# Patient Record
Sex: Male | Born: 1970 | Race: White | Hispanic: No | Marital: Single | Smoking: Former smoker
Health system: Southern US, Community
[De-identification: ages and names within clinical notes are randomized; demographics above are authoritative.]

## PROBLEM LIST (undated history)

## (undated) DIAGNOSIS — N200 Calculus of kidney: Secondary | ICD-10-CM

## (undated) HISTORY — PX: APPENDECTOMY: SHX54

## (undated) HISTORY — PX: BACK SURGERY: SHX140

---

## 2017-01-08 DIAGNOSIS — G8929 Other chronic pain: Secondary | ICD-10-CM | POA: Insufficient documentation

## 2017-01-08 DIAGNOSIS — M549 Dorsalgia, unspecified: Secondary | ICD-10-CM

## 2017-01-08 DIAGNOSIS — I10 Essential (primary) hypertension: Secondary | ICD-10-CM

## 2017-01-08 DIAGNOSIS — F17200 Nicotine dependence, unspecified, uncomplicated: Secondary | ICD-10-CM

## 2017-01-08 HISTORY — DX: Other chronic pain: G89.29

## 2017-01-08 HISTORY — DX: Dorsalgia, unspecified: M54.9

## 2017-01-08 HISTORY — DX: Nicotine dependence, unspecified, uncomplicated: F17.200

## 2017-01-08 HISTORY — DX: Essential (primary) hypertension: I10

## 2018-06-22 ENCOUNTER — Other Ambulatory Visit: Payer: Self-pay

## 2018-06-22 ENCOUNTER — Emergency Department
Admission: EM | Admit: 2018-06-22 | Discharge: 2018-06-22 | Disposition: A | Discharge status: Home / Self Care | Payer: Self-pay | Attending: Emergency Medicine | Admitting: Emergency Medicine

## 2018-06-22 ENCOUNTER — Emergency Department: Payer: Self-pay

## 2018-06-22 ENCOUNTER — Encounter: Payer: Self-pay | Admitting: Emergency Medicine

## 2018-06-22 DIAGNOSIS — N23 Unspecified renal colic: Secondary | ICD-10-CM | POA: Insufficient documentation

## 2018-06-22 HISTORY — DX: Calculus of kidney: N20.0

## 2018-06-22 LAB — CBC
HCT: 50.7 % (ref 39.0–52.0)
Hemoglobin: 17.1 g/dL — ABNORMAL HIGH (ref 13.0–17.0)
MCH: 29.7 pg (ref 26.0–34.0)
MCHC: 33.7 g/dL (ref 30.0–36.0)
MCV: 88.2 fL (ref 80.0–100.0)
Platelets: 245 10*3/uL (ref 150–400)
RBC: 5.75 MIL/uL (ref 4.22–5.81)
RDW: 13.3 % (ref 11.5–15.5)
WBC: 11.2 10*3/uL — ABNORMAL HIGH (ref 4.0–10.5)
nRBC: 0 % (ref 0.0–0.2)

## 2018-06-22 LAB — URINALYSIS, COMPLETE (UACMP) WITH MICROSCOPIC
Bacteria, UA: NONE SEEN
Bilirubin Urine: NEGATIVE
Glucose, UA: NEGATIVE mg/dL
Ketones, ur: NEGATIVE mg/dL
LEUKOCYTE UA: NEGATIVE
NITRITE: NEGATIVE
Protein, ur: NEGATIVE mg/dL
Specific Gravity, Urine: 1.021 (ref 1.005–1.030)
pH: 5 (ref 5.0–8.0)

## 2018-06-22 LAB — BASIC METABOLIC PANEL
Anion gap: 8 (ref 5–15)
BUN: 20 mg/dL (ref 6–20)
CO2: 26 mmol/L (ref 22–32)
Calcium: 9.2 mg/dL (ref 8.9–10.3)
Chloride: 104 mmol/L (ref 98–111)
Creatinine, Ser: 0.94 mg/dL (ref 0.61–1.24)
GFR calc Af Amer: >60 mL/min (ref >60)
GFR calc non Af Amer: >60 mL/min (ref >60)
Glucose, Bld: 117 mg/dL — ABNORMAL HIGH (ref 70–99)
POTASSIUM: 3.9 mmol/L (ref 3.5–5.1)
Sodium: 138 mmol/L (ref 135–145)

## 2018-06-22 MED ORDER — ONDANSETRON 4 MG PO TBDP
4.0000 mg | ORAL_TABLET | Freq: Once | ORAL | Status: AC
  Administered 2018-06-22: 4 mg via ORAL
  Filled 2018-06-22: qty 1

## 2018-06-22 MED ORDER — OXYCODONE HCL 5 MG PO TABS
5.0000 mg | ORAL_TABLET | Freq: Once | ORAL | Status: AC
  Administered 2018-06-22: 5 mg via ORAL
  Filled 2018-06-22: qty 1

## 2018-06-22 MED ORDER — TAMSULOSIN HCL 0.4 MG PO CAPS: 0.4000 mg | ORAL_CAPSULE | Freq: Every day | ORAL | 0 refills | Status: DC

## 2018-06-22 NOTE — ED Triage Notes (Signed)
Pt c/o R flank pain x 3 hrs with sudden onset. Pt states hx of kidney stones, states this feels similar. Pt also c/o some dysuria at this time.

## 2018-06-22 NOTE — ED Provider Notes (Signed)
[facility] Emergency Department Provider Note  ____________________________________________   First MD Initiated Contact with Patient 06/22/18 1926     (approximate)  I have reviewed the triage vital signs and the nursing notes.   HISTORY  Chief Complaint Flank Pain   HPI Darryl Harris is a 48 y.o. male who presents to the emergency department for treatment and evaluation of sudden onset right flank pain that started approximately 5 hours ago.  Patient has a history of kidney stones and this feels the same.  Last June was about 6 years ago.  It was 7 mm and he had to have a renal stent that time.  Since onset of pain today, he has had nausea without vomiting and has been "doubled over in pain."  No alleviating measures prior to arrival.    Past Medical History:  Diagnosis Date  . Kidney stones     There are no active problems to display for this patient.   Past Surgical History:  Procedure Laterality Date  . APPENDECTOMY    . BACK SURGERY      Prior to Admission medications   Medication Sig Start Date End Date Taking? Authorizing Provider  tamsulosin (FLOMAX) 0.4 MG CAPS capsule Take 1 capsule (0.4 mg total) by mouth daily. 06/22/18   Chinita Pester, FNP    Allergies Nsaids and Tylenol [acetaminophen]  History reviewed. No pertinent family history.  Social History Social History   Tobacco Use  . Smoking status: Former Games developer  . Smokeless tobacco: Former Engineer, water Use Topics  . Alcohol use: Not Currently  . Drug use: Never    Review of Systems  Constitutional: No fever/chills Eyes: No visual changes. ENT: No sore throat. Cardiovascular: Denies chest pain. Respiratory: Denies shortness of breath. Gastrointestinal: No abdominal pain.  Positive for nausea, no vomiting.  No diarrhea.  No constipation. Genitourinary: Positive for dysuria.   Musculoskeletal: Negative for back pain.  Positive for right flank pain Skin:  Negative for rash. Neurological: Negative for headaches, focal weakness or numbness.  ____________________________________________   PHYSICAL EXAM:  VITAL SIGNS: ED Triage Vitals  Enc Vitals Group     BP 06/22/18 1635 (!) 185/103     Pulse Rate 06/22/18 1635 98     Resp 06/22/18 1635 18     Temp 06/22/18 1635 98.4 F (36.9 C)     Temp Source 06/22/18 1635 Oral     SpO2 06/22/18 1635 99 %     Weight 06/22/18 1636 175 lb (79.4 kg)     Height 06/22/18 1636 5\' 7"  (1.702 m)     Head Circumference --      Peak Flow --      Pain Score 06/22/18 1636 8     Pain Loc --      Pain Edu? --      Excl. in [place]? --     Constitutional: Alert and oriented.  Uncomfortable appearing  Eyes: Conjunctivae are normal. Head: Atraumatic. Nose: No congestion/rhinnorhea. Mouth/Throat: Mucous membranes are moist.  Neck: No stridor.   Cardiovascular:  Good peripheral circulation. Respiratory: Normal respiratory effort.  No retractions. Lungs CTAB. Gastrointestinal: Soft and nontender. No distention. No abdominal bruits.  Right side CVA tenderness. Musculoskeletal: No lower extremity tenderness nor edema.   Neurologic:  Normal speech and language. No gross focal neurologic deficits are appreciated. No gait instability. Skin:  Skin is warm, dry and intact. Psychiatric: Mood and affect are normal. Speech and behavior are normal.  ____________________________________________  LABS (all labs ordered are listed, but only abnormal results are displayed)  Labs Reviewed  URINALYSIS, COMPLETE (UACMP) WITH MICROSCOPIC - Abnormal; Notable for the following components:      Result Value   Color, Urine YELLOW (*)    APPearance CLEAR (*)    Hgb urine dipstick MODERATE (*)    All other components within normal limits  BASIC METABOLIC PANEL - Abnormal; Notable for the following components:   Glucose, Bld 117 (*)    All other components within normal limits  CBC - Abnormal; Notable for the following  components:   WBC 11.2 (*)    Hemoglobin 17.1 (*)    All other components within normal limits   ____________________________________________  EKG  Not indicated ____________________________________________  RADIOLOGY  ED MD interpretation: 1 mm right lower pole renal stone without ureteral stone or hydronephrosis  Official radiology report(s): Ct Renal Stone Study  Result Date: 06/22/2018 CLINICAL DATA:  Right flank pain EXAM: CT ABDOMEN AND PELVIS WITHOUT CONTRAST TECHNIQUE: Multidetector CT imaging of the abdomen and pelvis was performed following the standard protocol without IV contrast. COMPARISON:  None. FINDINGS: Lower chest: Lung bases are clear. No effusions. Heart is normal size. Hepatobiliary: Diffuse low-density throughout the liver compatible with fatty infiltration. No focal abnormality. Gallbladder unremarkable. Pancreas: No focal abnormality or ductal dilatation. Spleen: No focal abnormality.  Normal size. Adrenals/Urinary Tract: Punctate nonobstructing 1 mm stone in the lower pole of the right kidney. No hydronephrosis or ureteral stones. No suspicious renal or adrenal mass. Urinary bladder unremarkable. Stomach/Bowel: Stomach, large and small bowel grossly unremarkable. Vascular/Lymphatic: No evidence of aneurysm or adenopathy. Reproductive: No visible focal abnormality. Other: No free fluid or free air. Musculoskeletal: Postoperative changes in the lower lumbar spine. No acute bony abnormality. IMPRESSION: Punctate 1 mm right lower pole renal stone. No ureteral stones or hydronephrosis. Fatty infiltration of the liver. No acute findings. Electronically Signed   By: Charlett Nose M.D.   On: 06/22/2018 20:24    ____________________________________________   PROCEDURES  Procedure(s) performed (including Critical Care):  Procedures   ____________________________________________   INITIAL IMPRESSION / ASSESSMENT AND PLAN / ED COURSE  As part of my medical decision  making, I reviewed the following data within the electronic MEDICAL RECORD NUMBER [place] Controlled Substance Database    48 year old male presents to the emergency department for treatment and evaluation of sudden onset right flank pain.  1 mm renal stone noted in the lower pole the right kidney without obstruction or hydronephrosis.  He was given Zofran and OxyIR.  Pain is somewhat better.  Awaiting urinalysis results.  ----------------------------------------- 9:41 PM on 06/22/2018 -----------------------------------------  Upon review of care everywhere, it appears that the patient has checked in to be evaluated at several hospitals today as well as visits on February 19 as well as a few visits in January.  Upon review of his controlled substance registry, he has multiple prescribers and multiple pharmacies.  Therefore, tonight he will not be given a prescription for any type of controlled substance.  He will be given Flomax.  He states that he is allergic to Tylenol and ibuprofen so there is not many options left for pain control.  He was advised that he should follow-up with urology. __________________________________________   FINAL CLINICAL IMPRESSION(S) / ED DIAGNOSES  Final diagnoses:  Renal colic on right side     ED Discharge Orders         Ordered    tamsulosin (FLOMAX) 0.4 MG CAPS capsule  Daily     06/22/18 2136           Note:  This document was prepared using Dragon voice recognition software and may include unintentional dictation errors.    Chinita Pester, FNP 06/22/18 2143    Phineas Semen, MD 06/22/18 [phone]

## 2019-05-10 ENCOUNTER — Other Ambulatory Visit: Payer: Self-pay

## 2019-05-10 ENCOUNTER — Emergency Department
Admission: EM | Admit: 2019-05-10 | Discharge: 2019-05-10 | Disposition: A | Discharge status: Home / Self Care | Payer: Self-pay | Attending: Emergency Medicine | Admitting: Emergency Medicine

## 2019-05-10 ENCOUNTER — Encounter: Payer: Self-pay | Admitting: Emergency Medicine

## 2019-05-10 ENCOUNTER — Emergency Department: Payer: Self-pay

## 2019-05-10 DIAGNOSIS — Z87891 Personal history of nicotine dependence: Secondary | ICD-10-CM | POA: Insufficient documentation

## 2019-05-10 DIAGNOSIS — M545 Low back pain, unspecified: Secondary | ICD-10-CM

## 2019-05-10 MED ORDER — CYCLOBENZAPRINE HCL 10 MG PO TABS: 10.0000 mg | ORAL_TABLET | Freq: Three times a day (TID) | ORAL | 0 refills | Status: AC | PRN

## 2019-05-10 MED ORDER — ORPHENADRINE CITRATE 30 MG/ML IJ SOLN
60.0000 mg | Freq: Two times a day (BID) | INTRAMUSCULAR | Status: DC
  Administered 2019-05-10: 13:00:00 60 mg via INTRAMUSCULAR
  Filled 2019-05-10: qty 2

## 2019-05-10 MED ORDER — LIDOCAINE 5 % EX PTCH
1.0000 | MEDICATED_PATCH | CUTANEOUS | Status: DC
  Filled 2019-05-10: qty 1

## 2019-05-10 MED ORDER — LIDOCAINE 4 % EX PTCH: 1.0000 | MEDICATED_PATCH | Freq: Every day | CUTANEOUS | 0 refills | Status: AC

## 2019-05-10 NOTE — ED Provider Notes (Signed)
[facility] Emergency Department Provider Note ____________________________________________  Time seen: Approximately 2:55 PM  I have reviewed the triage vital signs and the nursing notes.  HISTORY  Chief Complaint Back Pain   HPI Darryl Harris is a 49 y.o. male presents to the emergency department after falling about 4 feet off a step ladder. He states he was hanging a light for his mother and lost his balance. He denies striking his head or loss of consciousness. Back surgery several years ago. No alleviating measures attempted prior to arrival.  Past Medical History:  Diagnosis Date  . Kidney stones     There are no problems to display for this patient.   Past Surgical History:  Procedure Laterality Date  . APPENDECTOMY    . BACK SURGERY      Prior to Admission medications   Medication Sig Start Date End Date Taking? Authorizing Provider  cyclobenzaprine (FLEXERIL) 10 MG tablet Take 1 tablet (10 mg total) by mouth 3 (three) times daily as needed. 05/10/19   Tylon Kemmerling B, FNP  Lidocaine 4 % PTCH Apply 1 patch topically daily. 05/10/19   Victorino Dike, FNP    Allergies Nsaids and Tylenol [acetaminophen]  No family history on file.  Social History Social History   Tobacco Use  . Smoking status: Former Research scientist (life sciences)  . Smokeless tobacco: Former Network engineer Use Topics  . Alcohol use: Not Currently  . Drug use: Never    Review of Systems Constitutional: Well appearing. Respiratory: Negative for dyspnea. Cardiovascular: Negative for change in skin temperature or color. Musculoskeletal:   Negative for chronic steroid use   Negative for trauma in the presence of osteoporosis  Negative for age over 39 and trauma.  Negative for constitutional symptoms, or history of cancer  Negative for pain worse at night. Skin: Negative for rash, lesion, or wound.  Genitourinary: Negative for urinary retention. Rectal: Negative for fecal  incontinence or new onset constipation/bowel habit changes. Hematological/Immunilogical: Negative for immunosuppression, IV drug use, or fever Neurological: Negative for burning, tingling, numb, electric, radiating pain in the extremities.                        Negative for saddle anesthesia.                        Negative for focal neurologic deficit, progressive or disabling symptoms             Negative for saddle anesthesia. ____________________________________________   PHYSICAL EXAM:  VITAL SIGNS: ED Triage Vitals  Enc Vitals Group     BP 05/10/19 1203 (!) 154/92     Pulse Rate 05/10/19 1203 94     Resp 05/10/19 1203 20     Temp 05/10/19 1203 98 F (36.7 C)     Temp Source 05/10/19 1203 Oral     SpO2 05/10/19 1203 98 %     Weight 05/10/19 1205 170 lb (77.1 kg)     Height 05/10/19 1205 5\' 7"  (1.702 m)     Head Circumference --      Peak Flow --      Pain Score 05/10/19 1205 8     Pain Loc --      Pain Edu? --      Excl. in [place]? --     Constitutional: Alert and oriented. Well appearing and in no acute distress. Eyes: Conjunctivae are clear without discharge or drainage.  Head:  Atraumatic. Neck: Full, active range of motion. Respiratory: Respirations even and unlabored. Musculoskeletal: Full ROM of the back and extremities observed, Strength 5/5 of the lower extremities as tested. Neurologic: Reflexes of the lower extremities are 2+. Negative straight leg raise on the right and left side. Skin: Atraumatic.  Psychiatric: Behavior and affect are normal.  ____________________________________________   LABS (all labs ordered are listed, but only abnormal results are displayed)  Labs Reviewed - No data to display ____________________________________________  RADIOLOGY  No traumatic finding of lumbar spine. ____________________________________________   PROCEDURES  Procedure(s) performed:  Procedures ____________________________________________   INITIAL  IMPRESSION / ASSESSMENT AND PLAN / ED COURSE  Darryl Harris is a 49 y.o. male presents to the emergency department after reported fall yesterday. Image of the lumbar spine is negative for acute findings. Norflex given. Patient left the department before receiving lidoderm patch, prescriptions, and discharge papers.   Of note, overdose risk score 690.  Medications  orphenadrine (NORFLEX) injection 60 mg (60 mg Intramuscular Given 05/10/19 1303)  lidocaine (LIDODERM) 5 % 1 patch (1 patch Transdermal Not Given 05/10/19 1340)    ED Discharge Orders         Ordered    cyclobenzaprine (FLEXERIL) 10 MG tablet  3 times daily PRN     05/10/19 1319    Lidocaine 4 % PTCH  Daily     05/10/19 1319           Pertinent labs & imaging results that were available during my care of the patient were reviewed by me and considered in my medical decision making (see chart for details).  _________________________________________   FINAL CLINICAL IMPRESSION(S) / ED DIAGNOSES  Final diagnoses:  Acute lumbar back pain     If controlled substance prescribed during this visit, 12 month history viewed on the [facility] prior to issuing an initial prescription for Schedule II or III opiod.   Chinita Pester, FNP 05/10/19 1503    Phineas Semen, MD 05/10/19 1511

## 2019-05-10 NOTE — ED Notes (Signed)
See triage note  Presents s/p fall   States he fell approx 3-4 ft on carpet  Having lower back pain  Ambulates well to treatment room

## 2019-05-10 NOTE — ED Triage Notes (Signed)
Low back pain since fell off ladder yesterday. Radiates slightly to R leg. States fell about 3 feet.

## 2019-05-10 NOTE — ED Notes (Signed)
First Nurse Note: Pt ambulatory into ED, stating that he fell off of a ladder yesterday hanging a ceiling fan and is now having pain in his back. Pt is in NAD at this time.

## 2019-05-10 NOTE — ED Notes (Addendum)
Upon discharge, room found to be empty with on sign of patient returning.  This RN attempted to contact patient via phone using the contact number provided in the chart as well as the emergency contact number.  No answer at this time.  Will remove patient from board.  Electronic signature not obtained due to this reason.

## 2019-11-15 ENCOUNTER — Emergency Department: Payer: MEDICAID

## 2019-11-15 ENCOUNTER — Emergency Department
Admission: EM | Admit: 2019-11-15 | Discharge: 2019-11-15 | Disposition: A | Discharge status: Home / Self Care | Payer: MEDICAID | Source: Ambulatory Visit | Attending: Emergency Medicine | Admitting: Emergency Medicine

## 2019-11-15 DIAGNOSIS — R110 Nausea: Secondary | ICD-10-CM | POA: Insufficient documentation

## 2019-11-15 DIAGNOSIS — N200 Calculus of kidney: Secondary | ICD-10-CM | POA: Insufficient documentation

## 2019-11-15 DIAGNOSIS — Z87442 Personal history of urinary calculi: Secondary | ICD-10-CM

## 2019-11-15 DIAGNOSIS — N23 Unspecified renal colic: Secondary | ICD-10-CM

## 2019-11-15 DIAGNOSIS — R109 Unspecified abdominal pain: Secondary | ICD-10-CM

## 2019-11-15 LAB — CBC AND DIFFERENTIAL
Baso # K/uL: 0.1 10*3/uL (ref 0.0–0.1)
Basophil %: 0.8 %
Eos # K/uL: 0.3 10*3/uL (ref 0.0–0.5)
Eosinophil %: 2.9 %
Hematocrit: 48 % (ref 40–51)
Hemoglobin: 16.7 g/dL (ref 13.7–17.5)
IMM Granulocytes #: 0.1 10*3/uL — ABNORMAL HIGH (ref 0.0–0.0)
IMM Granulocytes: 0.5 %
Lymph # K/uL: 3.4 10*3/uL (ref 1.3–3.6)
Lymphocyte %: 30.9 %
MCH: 30 pg (ref 26–32)
MCHC: 35 g/dL (ref 32–37)
MCV: 88 fL (ref 79–92)
Mono # K/uL: 0.8 10*3/uL (ref 0.3–0.8)
Monocyte %: 7.1 %
Neut # K/uL: 6.3 10*3/uL — ABNORMAL HIGH (ref 1.8–5.4)
Nucl RBC # K/uL: 0 10*3/uL (ref 0.0–0.0)
Nucl RBC %: 0 /100 WBC (ref 0.0–0.2)
Platelets: 212 10*3/uL (ref 150–330)
RBC: 5.5 MIL/uL (ref 4.6–6.1)
RDW: 13.3 % (ref 11.6–14.4)
Seg Neut %: 57.8 %
WBC: 10.9 10*3/uL — ABNORMAL HIGH (ref 4.2–9.1)

## 2019-11-15 LAB — URINALYSIS REFLEX TO CULTURE
Glucose,UA: NEGATIVE mg/dL
Ketones, UA: NEGATIVE
Nitrite,UA: NEGATIVE
Specific Gravity,UA: 1.022 (ref 1.001–1.030)
pH,UA: 5.5 (ref 5.0–8.0)

## 2019-11-15 LAB — URINE MICROSCOPIC (IQ200): RBC,UA: >50 /hpf (ref 0–2)

## 2019-11-15 LAB — BASIC METABOLIC PANEL
Anion Gap: 15 (ref 7–16)
CO2: 21 mmol/L (ref 20–28)
Calcium: 10 mg/dL (ref 8.6–10.2)
Chloride: 104 mmol/L (ref 96–108)
Creatinine: 0.85 mg/dL (ref 0.67–1.17)
GFR,Black: 119 *
GFR,Caucasian: 103 *
Glucose: 88 mg/dL (ref 60–99)
Lab: 11 mg/dL (ref 6–20)
Potassium: 4.4 mmol/L (ref 3.3–5.1)
Sodium: 140 mmol/L (ref 133–145)

## 2019-11-15 MED ORDER — HYDROMORPHONE HCL PF 1 MG/ML IJ SOLN *WRAPPED*
1.0000 mg | Freq: Once | INTRAMUSCULAR | Status: AC
Start: 2019-11-15 — End: 2019-11-15
  Administered 2019-11-15: 1 mg via INTRAVENOUS
  Filled 2019-11-15: qty 1

## 2019-11-15 MED ORDER — ONDANSETRON HCL 2 MG/ML IV SOLN *I*
4.0000 mg | Freq: Four times a day (QID) | INTRAMUSCULAR | Status: DC | PRN
Start: 2019-11-15 — End: 2019-11-16

## 2019-11-15 MED ORDER — KETOROLAC TROMETHAMINE 30 MG/ML IJ SOLN *I*
15.0000 mg | Freq: Four times a day (QID) | INTRAMUSCULAR | Status: DC | PRN
Start: 2019-11-15 — End: 2019-11-16

## 2019-11-15 MED ORDER — HYDROMORPHONE HCL PF 1 MG/ML IJ SOLN *WRAPPED*
1.0000 mg | INTRAMUSCULAR | Status: DC | PRN
Start: 2019-11-15 — End: 2019-11-15

## 2019-11-15 MED ORDER — HYDROMORPHONE HCL PF 1 MG/ML IJ SOLN *WRAPPED*
1.0000 mg | INTRAMUSCULAR | Status: DC | PRN
Start: 2019-11-15 — End: 2019-11-15
  Administered 2019-11-15: 1 mg via INTRAVENOUS
  Filled 2019-11-15: qty 1

## 2019-11-15 MED ORDER — KETOROLAC TROMETHAMINE 30 MG/ML IJ SOLN *I*
30.0000 mg | Freq: Once | INTRAMUSCULAR | Status: DC
Start: 2019-11-15 — End: 2019-11-15

## 2019-11-15 MED ORDER — SODIUM CHLORIDE 0.9 % IV SOLN WRAPPED *I*
125.0000 mL/h | Status: DC
Start: 2019-11-15 — End: 2019-11-16

## 2019-11-15 MED ORDER — OXYCODONE HCL 5 MG PO TABS *I*
5.0000 mg | ORAL_TABLET | ORAL | Status: DC | PRN
Start: 2019-11-15 — End: 2019-11-16

## 2019-11-15 MED ORDER — SODIUM CHLORIDE 0.9 % IV SOLN WRAPPED *I*
250.0000 mL/h | Status: DC
Start: 2019-11-15 — End: 2019-11-16
  Administered 2019-11-15: 250 mL/h via INTRAVENOUS

## 2019-11-15 MED ORDER — OXYCODONE HCL 5 MG PO TABS *I*
5.0000 mg | ORAL_TABLET | ORAL | 0 refills | Status: DC | PRN
Start: 2019-11-15 — End: 2019-12-15

## 2019-11-15 NOTE — ED Triage Notes (Addendum)
Patient reports severe right flank pain that radiates towards back since this morning. Patient reports hematuria, painful to urinate +nausea   Hx of kidney stones     Triage Note   Fredrik Rigger, RN

## 2019-11-15 NOTE — Discharge Instructions (Addendum)
Continue on all of your current medication and care plan- with exception list below, if any  Follow up with your doctor in the next few days  Return if further problem or concern      In Addition-  If condition changes or worsen, return immediately        Tylenol for discomfort  Oxycodone for severe pain

## 2019-11-15 NOTE — ED Notes (Signed)
Pt to ED for c/o R sided flank pain that has been on going for 3 days now. Pt states he has a kidney stone that he has been trying to pass and manage at home. Pt endorses severe nausea and decreased PO intake. Pt endorses hematuria and dysuria. Pt denies any fever or chills.

## 2019-11-15 NOTE — First Provider Contact (Signed)
ED First Provider Contact Note      Initial provider evaluation/encounter performed by me on arrival to the ED    Initial VS-    Last Filed Vitals    11/15/19 1707   BP: (!) 154/94   Pulse: 100   Resp: 17   Temp: 36.4 C (97.5 F)   SpO2: 95%      Hx of kidney stone  Acute onset of right flank pain since this am  Noted hematuria and urinary discomfort  Assoc sx of nausea  No other acute distress or discomfort      Assessment:   Flank pain- prob kidney stone pain    Orders placed:  LABS and CT   IVF    Patient requires further evaluation.     Clarene Critchley, MD, 11/15/2019, 5:45 PM     Clarene Critchley, MD  11/15/19 1750

## 2019-11-19 ENCOUNTER — Emergency Department: Payer: MEDICAID

## 2019-11-19 ENCOUNTER — Observation Stay
Admission: EM | Admit: 2019-11-19 | Discharge: 2019-11-20 | Disposition: A | Discharge status: Home / Self Care | Payer: MEDICAID | Source: Ambulatory Visit | Attending: Emergency Medicine | Admitting: Emergency Medicine

## 2019-11-19 DIAGNOSIS — R1030 Lower abdominal pain, unspecified: Secondary | ICD-10-CM

## 2019-11-19 DIAGNOSIS — R110 Nausea: Secondary | ICD-10-CM

## 2019-11-19 DIAGNOSIS — Z9049 Acquired absence of other specified parts of digestive tract: Secondary | ICD-10-CM

## 2019-11-19 DIAGNOSIS — Z20822 Contact with and (suspected) exposure to covid-19: Secondary | ICD-10-CM

## 2019-11-19 DIAGNOSIS — R109 Unspecified abdominal pain: Secondary | ICD-10-CM | POA: Insufficient documentation

## 2019-11-19 DIAGNOSIS — N3091 Cystitis, unspecified with hematuria: Principal | ICD-10-CM | POA: Insufficient documentation

## 2019-11-19 DIAGNOSIS — R300 Dysuria: Secondary | ICD-10-CM | POA: Insufficient documentation

## 2019-11-19 DIAGNOSIS — D72829 Elevated white blood cell count, unspecified: Secondary | ICD-10-CM | POA: Insufficient documentation

## 2019-11-19 LAB — RUQ PANEL (ED ONLY)
ALT: 12 U/L (ref 0–50)
Albumin: 4.6 g/dL (ref 3.5–5.2)
Alk Phos: 86 U/L (ref 40–130)
Amylase: 70 U/L (ref 28–100)
Bilirubin,Direct: <0.2 mg/dL (ref 0.0–0.3)
Bilirubin,Total: 0.2 mg/dL (ref 0.0–1.2)
Lipase: 48 U/L (ref 13–60)
Total Protein: 7.8 g/dL — ABNORMAL HIGH (ref 6.3–7.7)

## 2019-11-19 LAB — POCT URINALYSIS DIPSTICK
Glucose,UA POCT: NORMAL mg/dL
Leuk Esterase,UA POCT: 1 — AB
Lot #: 51951501
Nitrite,UA POCT: POSITIVE — AB
PH,UA POCT: 5 (ref 5–8)

## 2019-11-19 LAB — BASIC METABOLIC PANEL
Anion Gap: 15 (ref 7–16)
CO2: 19 mmol/L — ABNORMAL LOW (ref 20–28)
Calcium: 10 mg/dL (ref 8.6–10.2)
Chloride: 105 mmol/L (ref 96–108)
Creatinine: 0.95 mg/dL (ref 0.67–1.17)
GFR,Black: 109 *
GFR,Caucasian: 94 *
Glucose: 120 mg/dL — ABNORMAL HIGH (ref 60–99)
Lab: 12 mg/dL (ref 6–20)
Potassium: 4.4 mmol/L (ref 3.3–5.1)
Sodium: 139 mmol/L (ref 133–145)

## 2019-11-19 LAB — CBC AND DIFFERENTIAL
Baso # K/uL: 0.1 10*3/uL (ref 0.0–0.1)
Basophil %: 0.9 %
Eos # K/uL: 0.3 10*3/uL (ref 0.0–0.5)
Eosinophil %: 2.6 %
Hematocrit: 53 % — ABNORMAL HIGH (ref 40–51)
Hemoglobin: 18.4 g/dL — ABNORMAL HIGH (ref 13.7–17.5)
IMM Granulocytes #: 0.1 10*3/uL — ABNORMAL HIGH (ref 0.0–0.0)
IMM Granulocytes: 0.5 %
Lymph # K/uL: 3.4 10*3/uL (ref 1.3–3.6)
Lymphocyte %: 27.3 %
MCH: 30 pg (ref 26–32)
MCHC: 35 g/dL (ref 32–37)
MCV: 88 fL (ref 79–92)
Mono # K/uL: 0.8 10*3/uL (ref 0.3–0.8)
Monocyte %: 6.1 %
Neut # K/uL: 7.8 10*3/uL — ABNORMAL HIGH (ref 1.8–5.4)
Nucl RBC # K/uL: 0 10*3/uL (ref 0.0–0.0)
Nucl RBC %: 0 /100 WBC (ref 0.0–0.2)
Platelets: 244 10*3/uL (ref 150–330)
RBC: 6.1 MIL/uL (ref 4.6–6.1)
RDW: 13.2 % (ref 11.6–14.4)
Seg Neut %: 62.6 %
WBC: 12.4 10*3/uL — ABNORMAL HIGH (ref 4.2–9.1)

## 2019-11-19 LAB — URINALYSIS REFLEX TO CULTURE
Glucose,UA: NEGATIVE mg/dL
Ketones, UA: NEGATIVE
Nitrite,UA: POSITIVE — AB
Specific Gravity,UA: 1.028 (ref 1.001–1.030)
pH,UA: 5 (ref 5.0–8.0)

## 2019-11-19 LAB — URINE MICROSCOPIC (IQ200): RBC,UA: >50 /hpf (ref 0–2)

## 2019-11-19 LAB — HM HIV SCREENING OFFERED

## 2019-11-19 MED ORDER — ONDANSETRON HCL 2 MG/ML IV SOLN *I*
4.0000 mg | Freq: Once | INTRAMUSCULAR | Status: AC
Start: 2019-11-19 — End: 2019-11-19
  Administered 2019-11-19: 4 mg via INTRAVENOUS
  Filled 2019-11-19: qty 2

## 2019-11-19 MED ORDER — DEXTROSE 5 % FLUSH FOR PUMPS *I*
0.0000 mL/h | INTRAVENOUS | Status: DC | PRN
Start: 2019-11-19 — End: 2019-11-20

## 2019-11-19 MED ORDER — KETOROLAC TROMETHAMINE 30 MG/ML IJ SOLN *I*
15.0000 mg | Freq: Once | INTRAMUSCULAR | Status: DC
Start: 2019-11-19 — End: 2019-11-20
  Filled 2019-11-19: qty 1

## 2019-11-19 MED ORDER — CEFTRIAXONE SODIUM 1 G IN STERILE WATER 10ML SYRINGE *I*
1000.0000 mg | Freq: Once | INTRAVENOUS | Status: AC
Start: 2019-11-20 — End: 2019-11-19
  Administered 2019-11-19: 1000 mg via INTRAVENOUS
  Filled 2019-11-19: qty 10

## 2019-11-19 MED ORDER — SODIUM CHLORIDE 0.9 % FLUSH FOR PUMPS *I*
0.0000 mL/h | INTRAVENOUS | Status: DC | PRN
Start: 2019-11-19 — End: 2019-11-20

## 2019-11-19 MED ORDER — HYDROMORPHONE HCL PF 1 MG/ML IJ SOLN *WRAPPED*
0.5000 mg | Freq: Once | INTRAMUSCULAR | Status: AC
Start: 2019-11-20 — End: 2019-11-19
  Administered 2019-11-19: 0.5 mg via INTRAVENOUS
  Filled 2019-11-19: qty 0.5

## 2019-11-19 MED ORDER — SODIUM CHLORIDE 0.9 % IV BOLUS *I*
2000.0000 mL | Freq: Once | Status: AC
Start: 2019-11-19 — End: 2019-11-20
  Administered 2019-11-19: 2000 mL via INTRAVENOUS

## 2019-11-19 NOTE — ED Provider Notes (Addendum)
History     Chief Complaint   Patient presents with    Flank Pain     49 year old male history of tobacco abuse, hypertension, chronic back pain, kidney stones, presents with concern for right-sided flank pain with radiation to his abdomen.  Of note was seen here 5 days ago with similar symptoms diagnosed with kidney stone discharge states symptoms had improved up until early this morning when symptoms came back worse than previously.  Throughout today is noted nausea but no vomiting.  Endorsed significant decreased urine output.  States it is difficult to urine and has burning sensation.  States this morning also began having gross hematuria.  No trauma travel.  No use of blood thinners.  States he has had "kidney stone pain 2 or 3 times" in his lifetime.  Of note recently returned to the area from [place] to reestablish residence here.  Has scheduled PCP establishment in 2 months.  No fevers chills constipation diarrhea.  No history of abdominal surgeries.      History provided by:  Patient and medical records  Language interpreter used: No        Medical/Surgical/Family History     Past Medical History:   Diagnosis Date    Chronic back pain greater than 3 months duration 01/08/2017    Current smoker 01/08/2017    Uncontrolled hypertension 01/08/2017        Patient Active Problem List   Diagnosis Code    Chronic back pain greater than 3 months duration M54.9, G89.29    Current smoker F17.200    Uncontrolled hypertension I10            No past surgical history on file.  No family history on file.       Social History     Tobacco Use    Smoking status: Not on file   Substance Use Topics    Alcohol use: Not on file    Drug use: Not on file     Living Situation     Questions Responses    Patient lives with     Homeless     Caregiver for other family member     External Services     Employment     Domestic Violence Risk                 Review of Systems   Review of Systems   Constitutional: Positive  for activity change. Negative for chills and fever.   Cardiovascular: Negative for chest pain and palpitations.   Gastrointestinal: Positive for abdominal pain and nausea. Negative for abdominal distention, blood in stool, constipation, diarrhea and vomiting.   Genitourinary: Positive for difficulty urinating, dysuria, flank pain and hematuria. Negative for frequency.   Neurological: Negative for dizziness, light-headedness and headaches.       Physical Exam     Triage Vitals  Triage Start: Start, (11/19/19 2034)   First Recorded BP: 169/85, Resp: 18, Temp: 36 C (96.8 F), Temp src: TEMPORAL Oxygen Therapy SpO2: 95 %, O2 Device: None (Room air), Heart Rate: (!) 113, (11/19/19 2035)  .  First Pain Reported  0-10 Scale: 6, Pain Location/Orientation: Abdomen, (11/19/19 2035)       Physical Exam  Vitals and nursing note reviewed.   Constitutional:       General: He is not in acute distress.     Appearance: Normal appearance. He is normal weight. He is not ill-appearing, toxic-appearing or diaphoretic.  Comments: Caucasian male squatting in the room.  Is very uncomfortable and in pain however nontoxic-appearing no acute distress   HENT:      Head: Normocephalic and atraumatic.      Mouth/Throat:      Pharynx: Oropharynx is clear.   Eyes:      Comments: Bilateral conjunctival injection   Cardiovascular:      Rate and Rhythm: Normal rate and regular rhythm.      Pulses: Normal pulses.      Heart sounds: No murmur heard.     Pulmonary:      Effort: Pulmonary effort is normal. No respiratory distress.      Breath sounds: Normal breath sounds. No wheezing.   Abdominal:      General: There is no distension.      Palpations: Abdomen is soft.      Tenderness: There is no abdominal tenderness. There is no right CVA tenderness, left CVA tenderness or guarding.   Musculoskeletal:      Cervical back: Neck supple.      Right lower leg: No edema.      Left lower leg: No edema.   Skin:     General: Skin is warm and dry.    Neurological:      General: No focal deficit present.      Mental Status: He is alert and oriented to person, place, and time. Mental status is at baseline.   Psychiatric:         Mood and Affect: Mood normal.         Behavior: Behavior normal.         Thought Content: Thought content normal.         Judgment: Judgment normal.         Medical Decision Making   Patient seen by me on:  11/19/2019    Assessment:  50 year old male history of tobacco abuse, hypertension, kidney stones, chronic back pain, presents with a concern for right flank pain off and on for the past 5 days.  Seen here 5 days ago with negative CT scan.  States symptoms acutely worsened today endorsed nausea.  No fevers chills.  Endorses dysuria and hematuria.  On exam he is in obvious pain however nontoxic and in no acute distress.    Differential diagnosis:  Nephrolithiasis, pyelonephritis, cystitis, appendicitis, cholecystitis,    Plan:  Orders Placed This Encounter      Urinalysis reflex to culture      Aerobic culture      CT abdomen and pelvis without contrast      CBC and differential      Basic metabolic panel      RUQ panel (ED only)      Urinalysis with reflex to Microscopic UA and reflex to Bacterial Culture      Urine microscopic (iq200)      Basic metabolic panel      CBC and differential      COVID-19 PCR      Performing Lab      Diet regular      Notify provider      Activity as tolerated      Out of bed TID      Vital signs      Up with assistance      Full code      POCT urinalysis dipstick      EKG 12 lead      HM HIV SCREENING OFFERED  Insert peripheral IV      Place in ED Observation Unit (EOU)        Independent review of: Existing labs, CT scans, chart/prior records    ED Course and Disposition:    Reassessment:  Mild leukocytosis 12,400.  H&H elevated today likely related to hypovolemia.  Stable renal function.  Urinalysis with significant blood, calcium oxalate crystals, as well as significant protein and nitrites concern  for possible infection.  Rocephin x1 provided.  CT scan without obvious ureteral lithiasis or nephrolithiasis.  No clear findings given patient's significant discomfort.  Given his recurrent presentation and findings today we will plan for EOU stay for antibiotics IV fluids and pain control.  Possible hemorrhagic cystitis today.  Otherwise stable.  Patient agreeable to admission.        Labs Reviewed   URINALYSIS REFLEX TO CULTURE - Abnormal; Notable for the following components:       Result Value    Color, UA Red (*)     Appearance,UR Turbid (*)     Leuk Esterase,UA 1+ (*)     Nitrite,UA POS (*)     Protein,UA 2+ (*)     Blood,UA 3+ (*)     All other components within normal limits   CBC AND DIFFERENTIAL - Abnormal; Notable for the following components:    WBC 12.4 (*)     Hemoglobin 18.4 (*)     Hematocrit 53 (*)     Neut # K/uL 7.8 (*)     IMM Granulocytes # 0.1 (*)     All other components within normal limits   BASIC METABOLIC PANEL - Abnormal; Notable for the following components:    Glucose 120 (*)     CO2 19 (*)     All other components within normal limits   RUQ PANEL (ED ONLY) - Abnormal; Notable for the following components:    Total Protein 7.8 (*)     All other components within normal limits   URINE MICROSCOPIC (IQ200) - Abnormal; Notable for the following components:    Ca Oxalate Crys,UA Present (*)     All other components within normal limits   POCT URINALYSIS DIPSTICK - Abnormal; Notable for the following components:    Leuk Esterase,UA POCT +1 (*)     Nitrite,UA POCT Positive (*)     Protein,UA POCT +++ 500mg /dL (*)     Ketones,UA POCT + small (*)     Blood,UA POCT About 250 (*)     All other components within normal limits   AEROBIC CULTURE   COVID-19 PCR   PERFORMING LAB   UA WITH REFLEX TO CULTURE   BASIC METABOLIC PANEL   CBC AND DIFFERENTIAL     CT abdomen and pelvis without contrast   Final Result      1. No CT evidence of radiopaque urolithiasis or obstructive uropathy. No bladder calculi  are identified.      2. The bladder is incompletely distended, limiting sensitivity of examination. Bladder wall lesion cannot be excluded.      3. Unremarkable CT appearance of the kidneys and ureters; no upper urinary tract lesions to suggest origin of hematuria identified.      4. Post appendectomy.      END OF IMPRESSION            [facility] submits this DICOM format image data and final report to the [facility], an independent secure electronic health information exchange, on a reciprocally searchable basis (with  patient authorization) for a minimum of 12 months after exam    date.          ED Course as of Nov 19 617   Fri Nov 19, 2019   2306 He reports Tylenol and NSAID allergies.  Will alter analgesia.  Discussed concern for kidney stone possible infection A.  Rocephin x1.  CT pending.          Candie Echevaria, PA      APP Review:    I had face-to-face interaction with the patient on 11/19/2019.    I was asked by APP to see this patient due to the complexity of the current medical presentation.      I have reviewed and agree with the above documentation and, in addition:    The history is notable for Hemorrhagic cystitis- sounds like stone but none on imaging.    Our plan is Pyridium, pain control, hydration.        Author:  Harrietta Guardian, MD       Candie Echevaria, PA  11/20/19 6599       Harrietta Guardian, MD  11/20/19 [phone]

## 2019-11-19 NOTE — ED Triage Notes (Signed)
Patient to ED with R side flank pain radiates to abdomen. Known kidney stone, attempted passing stone at home. Endorses nausea, dysuria, decreased urinary output.       Triage Note   Toney Sang, RN

## 2019-11-19 NOTE — ED Notes (Signed)
Pt arrived to ED with c/o abdominal pain since last night with known kidney stone. Reports nausea, dysuria, decreased urinary output, and extreme abdominal discomfort. Denies CP, SOB, vomiting, dizziness. A+O x4. Independent at baseline. Labs drawn. Call bell within reach. Will continue to monitor per orders.

## 2019-11-19 NOTE — ED Notes (Signed)
Patient transported to CT 

## 2019-11-20 ENCOUNTER — Other Ambulatory Visit: Payer: Self-pay

## 2019-11-20 ENCOUNTER — Encounter: Payer: Self-pay | Admitting: Emergency Medicine

## 2019-11-20 DIAGNOSIS — N3091 Cystitis, unspecified with hematuria: Secondary | ICD-10-CM

## 2019-11-20 LAB — BASIC METABOLIC PANEL
Anion Gap: 12 (ref 7–16)
CO2: 18 mmol/L — ABNORMAL LOW (ref 20–28)
Calcium: 7.1 mg/dL — ABNORMAL LOW (ref 8.6–10.2)
Chloride: 113 mmol/L — ABNORMAL HIGH (ref 96–108)
Creatinine: 0.79 mg/dL (ref 0.67–1.17)
GFR,Black: 122 *
GFR,Caucasian: 106 *
Glucose: 108 mg/dL — ABNORMAL HIGH (ref 60–99)
Lab: 11 mg/dL (ref 6–20)
Potassium: 3.2 mmol/L — ABNORMAL LOW (ref 3.3–5.1)
Sodium: 143 mmol/L (ref 133–145)

## 2019-11-20 LAB — CBC AND DIFFERENTIAL
Baso # K/uL: 0.1 10*3/uL (ref 0.0–0.1)
Basophil %: 0.7 %
Eos # K/uL: 0.3 10*3/uL (ref 0.0–0.5)
Eosinophil %: 3.3 %
Hematocrit: 42 % (ref 40–51)
Hemoglobin: 14 g/dL (ref 13.7–17.5)
IMM Granulocytes #: 0 10*3/uL (ref 0.0–0.0)
IMM Granulocytes: 0.5 %
Lymph # K/uL: 2.9 10*3/uL (ref 1.3–3.6)
Lymphocyte %: 34.6 %
MCH: 30 pg (ref 26–32)
MCHC: 34 g/dL (ref 32–37)
MCV: 90 fL (ref 79–92)
Mono # K/uL: 0.6 10*3/uL (ref 0.3–0.8)
Monocyte %: 6.6 %
Neut # K/uL: 4.6 10*3/uL (ref 1.8–5.4)
Nucl RBC # K/uL: 0 10*3/uL (ref 0.0–0.0)
Nucl RBC %: 0 /100 WBC (ref 0.0–0.2)
Platelets: 181 10*3/uL (ref 150–330)
RBC: 4.6 MIL/uL (ref 4.6–6.1)
RDW: 13.4 % (ref 11.6–14.4)
Seg Neut %: 54.3 %
WBC: 8.5 10*3/uL (ref 4.2–9.1)

## 2019-11-20 LAB — COVID-19 PCR

## 2019-11-20 LAB — COVID-19 NAAT (PCR): COVID-19 NAAT (PCR): NEGATIVE

## 2019-11-20 LAB — PERFORMING LAB

## 2019-11-20 MED ORDER — HYDROMORPHONE HCL PF 1 MG/ML IJ SOLN *WRAPPED*
0.5000 mg | INTRAMUSCULAR | Status: DC | PRN
Start: 2019-11-20 — End: 2019-11-20
  Administered 2019-11-20: 0.5 mg via INTRAVENOUS
  Filled 2019-11-20: qty 0.5

## 2019-11-20 MED ORDER — CEPHALEXIN 500 MG PO CAPS *I*
500.0000 mg | ORAL_CAPSULE | Freq: Four times a day (QID) | ORAL | 0 refills | Status: AC
Start: 2019-11-20 — End: 2019-11-30
  Filled 2019-11-20: qty 40, 10d supply, fill #0

## 2019-11-20 MED ORDER — PHENAZOPYRIDINE HCL 200 MG PO TABS *I*
200.0000 mg | ORAL_TABLET | Freq: Three times a day (TID) | ORAL | 0 refills | Status: DC | PRN
Start: 2019-11-20 — End: 2019-11-20

## 2019-11-20 MED ORDER — SODIUM CHLORIDE 0.9 % FLUSH FOR PUMPS *I*
0.0000 mL/h | INTRAVENOUS | Status: DC | PRN
Start: 2019-11-20 — End: 2019-11-20

## 2019-11-20 MED ORDER — OXYBUTYNIN CHLORIDE 5 MG PO TABS *I*
5.0000 mg | ORAL_TABLET | Freq: Three times a day (TID) | ORAL | 0 refills | Status: DC
Start: 2019-11-20 — End: 2019-11-20

## 2019-11-20 MED ORDER — ONDANSETRON HCL 2 MG/ML IV SOLN *I*
4.0000 mg | Freq: Four times a day (QID) | INTRAMUSCULAR | Status: DC | PRN
Start: 2019-11-20 — End: 2019-11-20
  Administered 2019-11-20: 4 mg via INTRAVENOUS
  Filled 2019-11-20: qty 2

## 2019-11-20 MED ORDER — SODIUM CHLORIDE 0.9 % IV SOLN WRAPPED *I*
125.0000 mL/h | Status: DC
Start: 2019-11-20 — End: 2019-11-20
  Administered 2019-11-20 (×2): 125 mL/h via INTRAVENOUS

## 2019-11-20 MED ORDER — OXYBUTYNIN CHLORIDE 5 MG PO TABS *I*
5.0000 mg | ORAL_TABLET | Freq: Three times a day (TID) | ORAL | Status: DC
Start: 2019-11-20 — End: 2019-11-20
  Administered 2019-11-20: 5 mg via ORAL
  Filled 2019-11-20: qty 1

## 2019-11-20 MED ORDER — HYDROMORPHONE HCL PF 1 MG/ML IJ SOLN *WRAPPED*
1.0000 mg | INTRAMUSCULAR | Status: AC | PRN
Start: 2019-11-20 — End: 2019-11-20
  Administered 2019-11-20 (×3): 1 mg via INTRAVENOUS
  Filled 2019-11-20 (×3): qty 1

## 2019-11-20 MED ORDER — SODIUM CHLORIDE 0.9 % IV SOLN WRAPPED *I*
200.0000 mL/h | Status: DC
Start: 2019-11-20 — End: 2019-11-20
  Administered 2019-11-20 (×2): 200 mL/h via INTRAVENOUS

## 2019-11-20 MED ORDER — DEXTROSE 5 % FLUSH FOR PUMPS *I*
0.0000 mL/h | INTRAVENOUS | Status: DC | PRN
Start: 2019-11-20 — End: 2019-11-20

## 2019-11-20 MED ORDER — ONDANSETRON HCL 2 MG/ML IV SOLN *I*
4.0000 mg | Freq: Four times a day (QID) | INTRAMUSCULAR | Status: DC | PRN
Start: 2019-11-20 — End: 2019-11-20

## 2019-11-20 MED ORDER — CEFTRIAXONE SODIUM 1 G IN STERILE WATER 10ML SYRINGE *I*
1000.0000 mg | Freq: Once | INTRAVENOUS | Status: AC
Start: 2019-11-20 — End: 2019-11-20
  Administered 2019-11-20: 1000 mg via INTRAVENOUS

## 2019-11-20 MED ORDER — CEPHALEXIN 500 MG PO CAPS *I*
500.0000 mg | ORAL_CAPSULE | Freq: Four times a day (QID) | ORAL | 0 refills | Status: DC
Start: 2019-11-20 — End: 2019-11-20

## 2019-11-20 MED ORDER — PHENAZOPYRIDINE HCL 100 MG PO TABS *I*
200.0000 mg | ORAL_TABLET | Freq: Three times a day (TID) | ORAL | Status: DC
Start: 2019-11-20 — End: 2019-11-20
  Administered 2019-11-20: 200 mg via ORAL
  Filled 2019-11-20: qty 2

## 2019-11-20 MED ORDER — OXYCODONE HCL 5 MG PO TABS *I*
10.0000 mg | ORAL_TABLET | ORAL | 0 refills | Status: DC | PRN
Start: 2019-11-20 — End: 2019-12-15
  Filled 2019-11-20: qty 20, 4d supply, fill #0

## 2019-11-20 MED ORDER — OXYBUTYNIN CHLORIDE 5 MG PO TABS *I*
5.0000 mg | ORAL_TABLET | Freq: Three times a day (TID) | ORAL | 0 refills | Status: DC
Start: 2019-11-20 — End: 2019-12-27
  Filled 2019-11-20: qty 15, 5d supply, fill #0

## 2019-11-20 MED ORDER — PHENAZOPYRIDINE HCL 200 MG PO TABS *I*
200.0000 mg | ORAL_TABLET | Freq: Three times a day (TID) | ORAL | 0 refills | Status: DC | PRN
Start: 2019-11-20 — End: 2020-01-17
  Filled 2019-11-20: qty 15, 5d supply, fill #0

## 2019-11-20 MED ORDER — HYDROMORPHONE HCL PF 1 MG/ML IJ SOLN *WRAPPED*
0.5000 mg | INTRAMUSCULAR | Status: AC | PRN
Start: 2019-11-20 — End: 2019-11-20

## 2019-11-20 NOTE — ED Notes (Signed)
Report Given To  Aundra Millet, EOU RN      Descriptive Sentence / Reason for Admission     Chief Complaint: flank pain    Admitting Diagnosis:  hemorrhagic cystitis      Active Issues / Relevant Events     Orientation Status: A&OOx4    Ambulates with: independent    Skin/wounds/specialty bed needs: none    Precautions: fall    O2 needs: RA    IV Access: 20G R hand    IV Infusions: NS 125    Pain control:  dilaudid q2h    Primary Language: English    PO status: regular    Patient Belongings list completed (yes or no):  y      To Do List  OrdKrdx  IVF  IV antibotics      Anticipatory Guidance / Discharge Planning    Plan of care: admit for hemorrhagic cystitis    Education needed: -    Psychosocial concerns: -    Support person with patient: n

## 2019-11-20 NOTE — ED Notes (Signed)
Report Given To  Novella Rob RN      Descriptive Sentence / Reason for Admission     Chief Complaint: flank pain    Admitting Diagnosis:  hemorrhagic cystitis      Active Issues / Relevant Events     Orientation Status: A&OOx4    Ambulates with: independent    Skin/wounds/specialty bed needs: none    Precautions: fall    O2 needs: RA    IV Access: 20G R hand    IV Infusions: bolus -> NS 125    Pain control:  dilaudid q2h    Primary Language: English    PO status: regular    Patient Belongings list completed (yes or no):  y      To Do List  OrdKrdx  IVF  IV antibotics  Routine labs      Anticipatory Guidance / Discharge Planning    Plan of care: admit for hemorrhagic cystitis    Education needed: -    Psychosocial concerns: -    Support person with patient: n

## 2019-11-20 NOTE — ED Obs Notes (Signed)
ED OBSERVATION ADMISSION NOTE    Patient seen by me today, 11/20/2019 at 10:32 AM    Current patient status: Observation    History     Chief Complaint   Patient presents with    Flank Pain     Steve Wu is a 49 yo man who presented to the ED yesterday with nagging right flank/groin pain.    It started about a week ago, gradually and became more severe over the next two days.    Came to the ED 7/19 with gross hematuria and flank pain. CT was negative.  Discharged with analgesics.    Symptoms became worse, so represented yesterday.    No fever or chills.    Has had kidney stones requiring treatment in the past.    Urinalysis was suspicious for UTI so started on ceftriaxone and referred to EOU.      Flank Pain  Pain location:  R flank  Pain quality: aching and pressure    Pain radiates to:  Suprapubic region  Pain severity:  Severe  Onset quality:  Gradual  Duration:  1 week  Timing:  Constant  Progression:  Waxing and waning  Chronicity:  New  Relieved by:  Nothing  Associated symptoms: dysuria, hematuria and nausea    Associated symptoms: no chills, no diarrhea, no fever, no hematemesis and no vomiting        Past Medical History:   Diagnosis Date    Chronic back pain greater than 3 months duration 01/08/2017    Current smoker 01/08/2017    Uncontrolled hypertension 01/08/2017       History reviewed. No pertinent surgical history.    No family history on file.    Social History      has no history on file for tobacco use, alcohol use, drug use, and sexual activity.    Living Situation     Questions Responses    Patient lives with     Homeless     Caregiver for other family member     External Services     Employment     Domestic Violence Risk           Review of Systems   Review of Systems   Constitutional: Negative for chills and fever.   HENT: Negative.    Gastrointestinal: Positive for nausea. Negative for diarrhea, hematemesis and vomiting.   Genitourinary: Positive for dysuria, flank pain and hematuria.   Skin:  Negative for rash.       Physical Exam   BP 149/72 (BP Location: Right arm)    Pulse 68    Temp 36.2 C (97.2 F) (Temporal)    Resp 16    Ht 1.702 m ('5\' 7"' )    Wt 79.4 kg (175 lb)    SpO2 96%    BMI 27.41 kg/m     Physical Exam  Constitutional:       Appearance: He is not ill-appearing or toxic-appearing.      Comments: Looks uncomfortable.   Eyes:      Pupils: Pupils are equal, round, and reactive to light.   Cardiovascular:      Rate and Rhythm: Normal rate.   Pulmonary:      Effort: Pulmonary effort is normal. No respiratory distress.   Abdominal:      General: There is no distension.      Tenderness: There is no abdominal tenderness. There is no guarding or rebound.   Genitourinary:     Penis:  Normal.       Testes: Normal.   Musculoskeletal:         General: Normal range of motion.   Skin:     General: Skin is warm and dry.   Neurological:      General: No focal deficit present.   Psychiatric:         Mood and Affect: Mood normal.         Tests      Labs:   All labs in the last 24 hours:   Recent Results (from the past 24 hour(s))   CBC and differential    Collection Time: 11/19/19 10:24 PM   Result Value Ref Range    WBC 12.4 (H) 4.2 - 9.1 THOU/uL    RBC 6.1 4.60 - 6.10 MIL/uL    Hemoglobin 18.4 (H) 13.7 - 17.5 g/dL    Hematocrit 53 (H) 40.00 - 51.00 %    MCV 88 79.0 - 92.0 fL    MCH 30 26 - 32 pg    MCHC 35 32 - 37 g/dL    RDW 13.2 11.6 - 14.4 %    Platelets 244 150 - 330 THOU/uL    Seg Neut % 62.6 %    Lymphocyte % 27.3 %    Monocyte % 6.1 %    Eosinophil % 2.6 %    Basophil % 0.9 %    Neut # K/uL 7.8 (H) 1.8 - 5.4 THOU/uL    Lymph # K/uL 3.4 1.3 - 3.6 THOU/uL    Mono # K/uL 0.8 0.3 - 0.8 THOU/uL    Eos # K/uL 0.3 0.0 - 0.5 THOU/uL    Baso # K/uL 0.1 0.0 - 0.1 THOU/uL    Nucl RBC % 0.0 0.0 - 0.2 /100 WBC    Nucl RBC # K/uL 0.0 0.0 - 0.0 THOU/uL    IMM Granulocytes # 0.1 (H) 0.0 - 0.0 THOU/uL    IMM Granulocytes 0.5 %   Basic metabolic panel    Collection Time: 11/19/19 10:24 PM   Result Value Ref Range     Glucose 120 (H) 60 - 99 mg/dL    Sodium 139 133 - 145 mmol/L    Potassium 4.4 3.3 - 5.1 mmol/L    Chloride 105 96 - 108 mmol/L    CO2 19 (L) 20 - 28 mmol/L    Anion Gap 15 7 - 16    UN 12 6 - 20 mg/dL    Creatinine 0.95 0.67 - 1.17 mg/dL    GFR,Caucasian 94 *    GFR,Black 109 *    Calcium 10.0 8.6 - 10.2 mg/dL   RUQ panel (ED only)    Collection Time: 11/19/19 10:24 PM   Result Value Ref Range    Amylase 70 28 - 100 U/L    Lipase 48 13 - 60 U/L    Total Protein 7.8 (H) 6.3 - 7.7 g/dL    Albumin 4.6 3.5 - 5.2 g/dL    Bilirubin,Total 0.2 0.0 - 1.2 mg/dL    Bili,Indirect see below 0.1 - 1.0 mg/dL    Bilirubin,Direct <0.2 0.0 - 0.3 mg/dL    Alk Phos 86 40 - 130 U/L    AST CANCELED 0 - 50 U/L    ALT 12 0 - 50 U/L   POCT urinalysis dipstick    Collection Time: 11/19/19 10:53 PM   Result Value Ref Range    Specific gravity,UA POCT Test Not Performed 1.002 - 1.030  PH,UA POCT 5.0 5 - 8    Leuk Esterase,UA POCT +1 (!) Negative    Nitrite,UA POCT Positive (!) Negative    Protein,UA POCT +++ 579m/dL (!) Negative mg/dL    Glucose,UA POCT Normal Normal mg/dL    Ketones,UA POCT + small (!) Negative mg/dL    Urobilinogen,UA      Bilirubin,Ur      Blood,UA POCT About 250 (!) Negative    Exp date 2020-08-26     Lot # [id number]   Urinalysis reflex to culture    Collection Time: 11/19/19 10:53 PM   Result Value Ref Range    Color, UA Red (!) Yellow    Appearance,UR Turbid (!) Clear    Specific Gravity,UA 1.028 1.001 - 1.030    Leuk Esterase,UA 1+ (!) NEGATIVE    Nitrite,UA POS (!) NEGATIVE    pH,UA 5.0 5.0 - 8.0    Protein,UA 2+ (!) NEGATIVE mg/dL    Glucose,UA NEG NEGATIVE mg/dL    Ketones, UA NEG NEGATIVE    Blood,UA 3+ (!) NEGATIVE   Urine microscopic (iq200)    Collection Time: 11/19/19 10:53 PM   Result Value Ref Range    RBC,UA >50 0 - 2 /hpf    WBC,UA see below 0 - 5 /hpf    Ca Oxalate Crys,UA Present (!) Not Present   Aerobic culture    Collection Time: 11/19/19 10:53 PM    Specimen: Urine (Clean catch, voided, midstream)    Result Value Ref Range    Aerobic Culture .    COVID-19 PCR    Collection Time: 11/20/19  1:27 AM   Result Value Ref Range    COVID-19 Source Nasopharyngeal     COVID-19 PCR NEG NEG   Performing Lab    Collection Time: 11/20/19  1:27 AM   Result Value Ref Range    Performing Lab see below    Basic metabolic panel    Collection Time: 11/20/19  6:14 AM   Result Value Ref Range    Glucose 108 (H) 60 - 99 mg/dL    Sodium 143 133 - 145 mmol/L    Potassium 3.2 (L) 3.3 - 5.1 mmol/L    Chloride 113 (H) 96 - 108 mmol/L    CO2 18 (L) 20 - 28 mmol/L    Anion Gap 12 7 - 16    UN 11 6 - 20 mg/dL    Creatinine 0.79 0.67 - 1.17 mg/dL    GFR,Caucasian 106 *    GFR,Black 122 *    Calcium 7.1 (L) 8.6 - 10.2 mg/dL   CBC and differential    Collection Time: 11/20/19  6:14 AM   Result Value Ref Range    WBC 8.5 4.2 - 9.1 THOU/uL    RBC 4.6 4.60 - 6.10 MIL/uL    Hemoglobin 14.0 13.7 - 17.5 g/dL    Hematocrit 42 40.00 - 51.00 %    MCV 90 79.0 - 92.0 fL    MCH 30 26 - 32 pg    MCHC 34 32 - 37 g/dL    RDW 13.4 11.6 - 14.4 %    Platelets 181 150 - 330 THOU/uL    Seg Neut % 54.3 %    Lymphocyte % 34.6 %    Monocyte % 6.6 %    Eosinophil % 3.3 %    Basophil % 0.7 %    Neut # K/uL 4.6 1.8 - 5.4 THOU/uL    Lymph # K/uL 2.9 1.3 -  3.6 THOU/uL    Mono # K/uL 0.6 0.3 - 0.8 THOU/uL    Eos # K/uL 0.3 0.0 - 0.5 THOU/uL    Baso # K/uL 0.1 0.0 - 0.1 THOU/uL    Nucl RBC % 0.0 0.0 - 0.2 /100 WBC    Nucl RBC # K/uL 0.0 0.0 - 0.0 THOU/uL    IMM Granulocytes # 0.0 0.0 - 0.0 THOU/uL    IMM Granulocytes 0.5 %     *Note: Due to a large number of results and/or encounters for the requested time period, some results have not been displayed. A complete set of results can be found in Results Review.        CT abdomen and pelvis without contrast    Result Date: 11/20/2019  11/19/2019 11:19 PM CT ABDOMEN AND PELVIS WITHOUT CONTRAST CLINICAL INFORMATION:  Flank pain, kidney stone suspected. COMPARISON:  CT abdomen and pelvis, 11/15/2019. PROCEDURE:  Contiguous images  were obtained through the abdomen and pelvis without intravenous contrast.  Automated exposure control, adjustment of the mA and/or kV according to patient size, and/or iterative reconstruction techniques were utilized for radiation dose optimization. FINDINGS: Chest Base: Unremarkable. Liver/Biliary Tract: The gallbladder is collapsed. Otherwise unremarkable Pancreas: Unremarkable. Spleen: Unremarkable. Adrenals: Unremarkable. Kidneys and Collecting Systems: No focal renal lesions. No radiopaque urolithiasis. No hydroureteronephrosis. Lymph Nodes: No lymphadenopathy by CT size criteria. Vessels: Scattered vascular calcifications. GI Tract/Mesentery and Peritoneal Cavity: No intraperitoneal free air or free fluid. Post appendectomy. No bowel obstruction. Prostate Glands/Seminal Vesicles: Unremarkable. Bladder: Incompletely distended, limiting evaluation.  No focal wall or intraluminal abnormalities are identified. Soft Tissues/Musculoskeletal: No acute abnormality. Suture material overlying the LEFT iliopsoas and external iliac artery and vein (series 201 image 159). Posterior fusion, transpedicular screws and interlocking rods, and interbody instrumentation at the L5-S1 level.     1. No CT evidence of radiopaque urolithiasis or obstructive uropathy. No bladder calculi are identified. 2. The bladder is incompletely distended, limiting sensitivity of examination. Bladder wall lesion cannot be excluded. 3. Unremarkable CT appearance of the kidneys and ureters; no upper urinary tract lesions to suggest origin of hematuria identified. 4. Post appendectomy. END OF IMPRESSION [facility] submits this DICOM format image data and final report to the [facility], an independent secure electronic health information exchange, on a reciprocally searchable basis (with patient authorization) for a minimum of 12 months after exam date.    CT abdomen and pelvis without contrast    Result Date: 11/15/2019  11/15/2019 6:18 PM CT  ABDOMEN AND PELVIS WITHOUT CONTRAST CLINICAL INFORMATION:  Flank pain, kidney stone suspected, right flank pain. hx of kidney stone. COMPARISON:  02/18/2016 PROCEDURE:  Contiguous images were obtained through the abdomen and pelvis without intravenous contrast.  Automated exposure control, adjustment of the mA and/or kV according to patient size, and/or iterative reconstruction techniques were utilized for radiation dose optimization. FINDINGS: The vascular structures, lymph nodes and solid organs are incompletely evaluated without intravenous contrast. Lower Thorax: Focal scarring in the right lower lobe abutting the diaphragm unchanged. Liver: Unremarkable. Gallbladder: Unremarkable. Biliary Tree: Unremarkable. Pancreas: Unremarkable. Spleen: Unremarkable. Lymph Nodes: No enlarged nodes by CT size criteria (greater than 1 cm). Vasculature:  Unremarkable. Adrenal Glands: Unremarkable. Right Kidney: Unremarkable. Left Kidney: Unremarkable. GI Tract: Unremarkable. Bladder: Unremarkable. There are calcified phleboliths in the pelvic floor. Reproductive Organs: Unremarkable. Musculoskeletal/Soft Tissues: Bony degenerative changes. There is sequelae of lower lumbar instrument fusion with artifact that partially limits visualization in this region. Abdominal/Pelvic Cavity: Unremarkable.  1. No evidence of hydronephrosis or urolithiasis. 2. No acute process identified. END OF IMPRESSION [facility] submits this DICOM format image data and final report to the [facility], an independent secure electronic health information exchange, on a reciprocally searchable basis (with patient authorization) for a minimum of 12 months after exam date.      Medical Decision Making        Assessment:    49 y.o., male placed in OBS after evaluation in the ED for hematuria and flank pain with dysuria.    Presentation is most consistent with hemorrhagic cystitis.  No sign of stone on CT scan(s).     Will re-dose ceftriaxone.  Rx with  analgesics, ditropan, pyridium and keflex for 10 days.  Will refer to urology later this week.        Glory Rosebush, MD     Glory Rosebush, MD  11/20/19 1034

## 2019-11-20 NOTE — ED Provider Notes (Signed)
History     Chief Complaint   Patient presents with    Flank Pain       See eRecord/CareEverywhere for details of past medical issues and recent visits/communications      Patient with history of kidney stone.  For the past 3 days patient reported persistent right-sided flank discomfort with radiation to the right lower abdomen.  Noticeable hematuria.  Associated symptom of nausea.  No report of fever or chill.  No other acute distress or discomfort.  No relief of discomfort with change position or resting.    Currently patient is comfortable and pacing in the room.          Medical/Surgical/Family History     Past Medical History:   Diagnosis Date    Chronic back pain greater than 3 months duration 01/08/2017    Current smoker 01/08/2017    Uncontrolled hypertension 01/08/2017        Patient Active Problem List   Diagnosis Code    Chronic back pain greater than 3 months duration M54.9, G89.29    Current smoker F17.200    Uncontrolled hypertension I10            History reviewed. No pertinent surgical history.  No family history on file.       Social History     Tobacco Use    Smoking status: Not on file   Substance Use Topics    Alcohol use: Not on file    Drug use: Not on file     Living Situation     Questions Responses    Patient lives with     Homeless     Caregiver for other family member     External Services     Employment     Domestic Violence Risk                 Review of Systems   Review of Systems   Constitutional: Negative for activity change and fatigue.   HENT: Negative.    Eyes: Negative.    Respiratory: Negative.  Negative for chest tightness and shortness of breath.    Cardiovascular: Negative.  Negative for chest pain and palpitations.   Gastrointestinal: Positive for abdominal pain and nausea.   Genitourinary: Positive for flank pain.   Skin: Negative.  Negative for rash.   Allergic/Immunologic: Negative for immunocompromised state.   Neurological: Negative.  Negative for dizziness,  weakness and light-headedness.   Hematological: Negative for adenopathy.   Psychiatric/Behavioral: Negative.    All other systems reviewed and are negative.      Physical Exam     Triage Vitals  Triage Start: Start, (11/15/19 1707)   First Recorded BP: (!) 154/94, Resp: 17, Temp: 36.4 C (97.5 F), Temp src: TEMPORAL Oxygen Therapy SpO2: 95 %, Oximetry Source: Lt Hand, O2 Device: None (Room air), Heart Rate: 100, (11/15/19 1707)  .  First Pain Reported  0-10 Scale: 9, Pain Location/Orientation: Flank Right, Pain Descriptors: Constant, (11/15/19 1707)       Physical Exam  Vitals and nursing note reviewed.   Constitutional:       General: He is not in acute distress.     Appearance: Normal appearance. He is well-developed. He is not ill-appearing or toxic-appearing.      Comments: Uncomfortable appearance but in no acute distress and non toxic.      Pacing around in his treatment room.   HENT:      Head: Normocephalic and atraumatic.  Nose: Nose normal.   Eyes:      Conjunctiva/sclera: Conjunctivae normal.   Cardiovascular:      Rate and Rhythm: Normal rate and regular rhythm.   Pulmonary:      Effort: Pulmonary effort is normal. No respiratory distress.      Breath sounds: Normal breath sounds.   Abdominal:      General: Abdomen is flat. Bowel sounds are normal. There is no distension.      Palpations: Abdomen is soft. There is no mass.      Tenderness: There is no abdominal tenderness.      Comments: No obvious CVA to percussion.    Musculoskeletal:         General: Normal range of motion.      Cervical back: Normal range of motion and neck supple.   Skin:     General: Skin is warm and dry.   Neurological:      General: No focal deficit present.      Mental Status: He is alert and oriented to person, place, and time.   Psychiatric:         Behavior: Behavior normal.         Thought Content: Thought content normal.         Judgment: Judgment normal.         Medical Decision Making          Amount and/or  Complexity of Data Reviewed  Clinical lab tests: ordered and reviewed  Tests in the radiology section of CPT: reviewed and ordered  Tests in the medicine section of CPT: ordered and reviewed  Decide to obtain previous medical records or to obtain history from someone other than the patient: yes  Review and summarize past medical records: yes  Independent visualization of images, tracings, or specimens: yes           Mathews Robinsons, MD              Lab results: 11/20/19  0614 11/19/19  2224 11/15/19  1844   WBC 8.5 12.4* 10.9*   Hemoglobin 14.0 18.4* 16.7   Hematocrit 42 53* 48   RBC 4.6 6.1 5.5   Platelets 181 244 212   Neut # K/uL 4.6 7.8* 6.3*   Lymph # K/uL 2.9 3.4 3.4   Mono # K/uL 0.6 0.8 0.8   Eos # K/uL 0.3 0.3 0.3   Baso # K/uL 0.1 0.1 0.1   Seg Neut % 54.3 62.6 57.8   Lymphocyte % 34.6 27.3 30.9   Monocyte % 6.6 6.1 7.1   Eosinophil % 3.3 2.6 2.9   Basophil % 0.7 0.9 0.8                 Lab results: 11/20/19  0614 11/19/19  2224 11/15/19  1844   Sodium 143 139 140   Potassium 3.2* 4.4 4.4   Chloride 113* 105 104   CO2 18* 19* 21   UN '11 12 11   ' Creatinine 0.79 0.95 0.85   GFR,Caucasian 106 94 103   GFR,Black 122 109 119   Glucose 108* 120* 88   Calcium 7.1* 10.0 10.0   Total Protein  --  7.8*  --    Albumin  --  4.6  --    ALT  --  12  --    AST  --  CANCELED  --    Alk Phos  --  86  --    Bilirubin,Total  --  0.2  --          No results for input(s): PTI, INR in the last 8760 hours.          Lab results: 11/19/19  2253 11/15/19  2044 11/15/19  2044   Appearance,UR Turbid*   < > Cloudy*   Color, UA Red*   < > Orange*   Glucose,UA NEG   < > NEG   Ketones, UA NEG   < > NEG   Specific Gravity,UA 1.028   < > 1.022   Blood,UA 3+*   < > 3+*   pH,UA 5.0   < > 5.5   Protein,UA 2+*   < > Trace*   Nitrite,UA POS*   < > NEG   Leuk Esterase,UA 1+*   < > 1+*   RBC,UA >50   < > >50   WBC,UA see below   < > 6 - 10*   Mucus,UA  --   --  Present   Glucose,UA POCT Normal  --   --    Ketones,UA POCT + small*  --   --     Specific gravity,UA POCT Test Not Performed  --   --    Blood,UA POCT About 250*  --   --    PH,UA POCT 5.0  --   --    Protein,UA POCT +++ 524m/dL*  --   --    Nitrite,UA POCT Positive*  --   --    Leuk Esterase,UA POCT +1*  --   --     < > = values in this interval not displayed.        Pt with clinical history and examination to suggest renal colic.  CT scan does not demonstrate a stone.  Patient wants to try to pass the stone himself.  Will provide limited supply of pain medicine.    On review of the [place]    Will tx pt with pain med and flomax and antiemetic med.                CMathews Robinsons MD  11/20/19 [phone]

## 2019-11-20 NOTE — ED Notes (Signed)
11/20/19 0131   Observation Care   Observation care initiated  Yes   Patient has been verbally notified of their observation status Yes   Report Recieved From ED Nurse Yes   Unit Based Transportation Requested Yes

## 2019-11-20 NOTE — ED Notes (Signed)
Plan of Care     Pt is alert & oriented x4. Independent at baseline. He is agreeable to the plan including    -IV fluids  -pain control  -symptom management

## 2019-11-21 ENCOUNTER — Encounter: Payer: Self-pay | Admitting: Family Medicine

## 2019-11-21 DIAGNOSIS — N3091 Cystitis, unspecified with hematuria: Secondary | ICD-10-CM | POA: Insufficient documentation

## 2019-11-21 LAB — AEROBIC CULTURE: Aerobic Culture: 0

## 2019-11-22 ENCOUNTER — Other Ambulatory Visit: Payer: Self-pay

## 2019-11-22 LAB — UNMAPPED LAB RESULTS
Basophil # (HT): 0.1 10 3/uL — NL (ref 0.0–0.2)
Basophil % (HT): 1 % — NL (ref 0–3)
Eosinophil # (HT): 0.2 10 3/uL — NL (ref 0.1–0.6)
Eosinophil % (HT): 3 % — NL (ref 0–5)
Hematocrit (HT): 50 % — NL (ref 40–52)
Hemoglobin (HGB) (HT): 16.9 g/dL — NL (ref 13.0–18.0)
Lymphocyte # (HT): 2.3 10 3/uL — NL (ref 1.0–4.8)
Lymphocyte % (HT): 29 % — NL (ref 15–45)
MCHC (HT): 34 g/dL — NL (ref 32.0–37.5)
MCV (HT): 88 fL — NL (ref 80–100)
Mean Corpuscular Hemoglobin (MCH) (HT): 30 pg — NL (ref 26.0–34.0)
Monocyte # (HT): 0.5 10 3/uL — NL (ref 0.1–1.0)
Monocyte % (HT): 6 % — NL (ref 0–15)
Neutrophil # (HT): 4.7 10 3/uL — NL (ref 1.8–8.0)
Platelets (HT): 208 10 3/uL — NL (ref 150–450)
RBC (HT): 5.64 10 6/uL — NL (ref 4.40–6.20)
RDW (HT): 13.1 % — NL (ref 9.0–15.2)
Seg Neut % (HT): 60 % — NL (ref 45–75)
WBC (HT): 7.8 10 3/uL — NL (ref 4.0–11.0)

## 2019-11-23 ENCOUNTER — Other Ambulatory Visit: Payer: Self-pay

## 2019-11-23 DIAGNOSIS — M5116 Intervertebral disc disorders with radiculopathy, lumbar region: Secondary | ICD-10-CM | POA: Insufficient documentation

## 2019-11-23 DIAGNOSIS — E782 Mixed hyperlipidemia: Secondary | ICD-10-CM | POA: Insufficient documentation

## 2019-11-24 ENCOUNTER — Other Ambulatory Visit: Payer: Self-pay | Admitting: Emergency Medicine

## 2019-11-24 DIAGNOSIS — R1011 Right upper quadrant pain: Secondary | ICD-10-CM

## 2019-11-24 LAB — CBC
Baso # K/uL: 0.09 10*3/uL (ref 0.00–0.10)
Basophil %: 0.9 % (ref 0–2)
Eos # K/uL: 0.24 10*3/uL (ref 0.00–0.70)
Eosinophil %: 2.3 % (ref 0–7)
Hematocrit: 51.9 % — ABNORMAL HIGH (ref 39.0–51.0)
Hemoglobin: 17.8 g/dL (ref 14.0–18.0)
IMM Granulocytes #: 0.03 10*3/uL (ref 0.00–0.30)
IMM Granulocytes: 0.3 % (ref 0.0–2.0)
Lymph # K/uL: 2.67 10*3/uL (ref 0.80–2.80)
Lymphocyte %: 25.2 % (ref 12–44)
MCH: 29.7 pg (ref 26.0–33.0)
MCHC: 34.3 g/dL (ref 31.0–36.0)
MCV: 87 fL (ref 80–94)
Mean Platelet Volume: 8.8 fL (ref 8.6–11.7)
Mono # K/uL: 0.71 10*3/uL (ref 0.00–1.00)
Monocyte %: 6.7 % (ref 0–10)
Neut # K/uL: 6.84 10*3/uL (ref 1.50–7.10)
Nucl RBC # K/uL: 0 10*3/uL (ref 0.000–0.012)
Nucl RBC %: 0 /100 WBC (ref 0.0–0.2)
Platelets: 227 10*3/uL (ref 150–400)
RBC Distribution Width-SD: 40 fL (ref 35.1–43.9)
RBC: 6 10*6/uL (ref 4.60–6.20)
RDW: 12.8 % (ref 11.6–14.4)
Seg Neut %: 64.6 % (ref 47–76)
WBC: 10.6 10*3/uL (ref 4.3–11.0)

## 2019-11-24 LAB — COMPREHENSIVE METABOLIC PANEL
ALT: 15 U/L — ABNORMAL LOW (ref 21–72)
AST: 33 U/L (ref 17–59)
Albumin: 4.8 g/dL (ref 3.5–5.0)
Alk Phos: 70 U/L (ref 38–126)
Anion Gap: 12 (ref 3–12)
Bilirubin,Total: 0.4 mg/dL (ref 0.2–1.3)
CO2: 22 mmol/L (ref 22–30)
Calcium: 9.5 mg/dL (ref 8.4–10.2)
Chloride: 106 mmol/L (ref 98–107)
Creatinine: 0.8 mg/dl (ref 0.80–1.50)
GFR,Caucasian: 110 (ref >60)
Glucose: 99 mg/dL (ref 74–106)
Lab: 10 mg/dL (ref 9–20)
Potassium: 4.4 mmol/L (ref 3.5–5.1)
Sodium: 141 mmol/L (ref 137–145)
Total Protein: 8.1 g/dL (ref 6.3–8.2)

## 2019-11-25 DIAGNOSIS — Z87442 Personal history of urinary calculi: Secondary | ICD-10-CM | POA: Insufficient documentation

## 2019-11-28 ENCOUNTER — Other Ambulatory Visit: Payer: Self-pay | Admitting: General Surgery

## 2019-11-28 LAB — URINALYSIS REFLEX TO CULTURE
Bilirubin,Ur: NEGATIVE
Glucose,UA: NEGATIVE mg/dL
Ketones, UA: NEGATIVE mg/dL
Leuk Esterase,UA: NEGATIVE
Nitrite,UA: NEGATIVE
Protein,UA: NEGATIVE mg/dL
Specific Gravity,UA: 1.025 (ref 1.001–1.035)
Urobilinogen,UA: 1 EU/dL (ref <=1.0)
pH,UA: 5.5 (ref 5.0–6.5)

## 2019-11-28 LAB — URINE MICROSCOPIC (IQ200)

## 2019-11-29 ENCOUNTER — Emergency Department: Payer: MEDICAID

## 2019-11-29 ENCOUNTER — Emergency Department
Admission: EM | Admit: 2019-11-29 | Discharge: 2019-11-29 | Disposition: A | Discharge status: Home / Self Care | Payer: MEDICAID | Source: Ambulatory Visit | Attending: Emergency Medicine | Admitting: Emergency Medicine

## 2019-11-29 DIAGNOSIS — Z87442 Personal history of urinary calculi: Secondary | ICD-10-CM | POA: Insufficient documentation

## 2019-11-29 DIAGNOSIS — R319 Hematuria, unspecified: Secondary | ICD-10-CM | POA: Insufficient documentation

## 2019-11-29 DIAGNOSIS — R109 Unspecified abdominal pain: Secondary | ICD-10-CM | POA: Insufficient documentation

## 2019-11-29 LAB — URINALYSIS REFLEX TO CULTURE
Glucose,UA: NEGATIVE mg/dL
Ketones, UA: NEGATIVE
Nitrite,UA: NEGATIVE
Specific Gravity,UA: 1.022 (ref 1.001–1.030)
pH,UA: 5.5 (ref 5.0–8.0)

## 2019-11-29 LAB — RUQ PANEL (ED ONLY)
ALT: 13 U/L (ref 0–50)
AST: 17 U/L (ref 0–50)
Albumin: 4.8 g/dL (ref 3.5–5.2)
Alk Phos: 78 U/L (ref 40–130)
Amylase: 58 U/L (ref 28–100)
Bilirubin,Direct: <0.2 mg/dL (ref 0.0–0.3)
Bilirubin,Total: 0.3 mg/dL (ref 0.0–1.2)
Lipase: 41 U/L (ref 13–60)
Total Protein: 7.5 g/dL (ref 6.3–7.7)

## 2019-11-29 LAB — BASIC METABOLIC PANEL
Anion Gap: 15 (ref 7–16)
CO2: 22 mmol/L (ref 20–28)
Calcium: 9.9 mg/dL (ref 8.6–10.2)
Chloride: 103 mmol/L (ref 96–108)
Creatinine: 0.86 mg/dL (ref 0.67–1.17)
GFR,Black: 118 *
GFR,Caucasian: 102 *
Glucose: 137 mg/dL — ABNORMAL HIGH (ref 60–99)
Lab: 13 mg/dL (ref 6–20)
Potassium: 4 mmol/L (ref 3.3–5.1)
Sodium: 140 mmol/L (ref 133–145)

## 2019-11-29 LAB — CBC AND DIFFERENTIAL
Baso # K/uL: 0.1 10*3/uL (ref 0.0–0.1)
Basophil %: 0.7 %
Eos # K/uL: 0.2 10*3/uL (ref 0.0–0.5)
Eosinophil %: 1.8 %
Hematocrit: 49 % (ref 40–51)
Hemoglobin: 17.3 g/dL (ref 13.7–17.5)
IMM Granulocytes #: 0 10*3/uL (ref 0.0–0.0)
IMM Granulocytes: 0.2 %
Lymph # K/uL: 2.4 10*3/uL (ref 1.3–3.6)
Lymphocyte %: 27.1 %
MCH: 31 pg (ref 26–32)
MCHC: 35 g/dL (ref 32–37)
MCV: 87 fL (ref 79–92)
Mono # K/uL: 0.6 10*3/uL (ref 0.3–0.8)
Monocyte %: 6.6 %
Neut # K/uL: 5.5 10*3/uL — ABNORMAL HIGH (ref 1.8–5.4)
Nucl RBC # K/uL: 0 10*3/uL (ref 0.0–0.0)
Nucl RBC %: 0 /100 WBC (ref 0.0–0.2)
Platelets: 213 10*3/uL (ref 150–330)
RBC: 5.6 MIL/uL (ref 4.6–6.1)
RDW: 13.1 % (ref 11.6–14.4)
Seg Neut %: 63.6 %
WBC: 8.7 10*3/uL (ref 4.2–9.1)

## 2019-11-29 LAB — URINE MICROSCOPIC (IQ200): RBC,UA: >50 /hpf (ref 0–2)

## 2019-11-29 LAB — POCT URINALYSIS DIPSTICK
Glucose,UA POCT: NORMAL mg/dL
Ketones,UA POCT: NEGATIVE mg/dL
Leuk Esterase,UA POCT: 1 — AB
Lot #: 51951501
Nitrite,UA POCT: NEGATIVE
PH,UA POCT: 5 (ref 5–8)

## 2019-11-29 MED ORDER — SODIUM CHLORIDE 0.9 % FLUSH FOR PUMPS *I*
0.0000 mL/h | INTRAVENOUS | Status: DC | PRN
Start: 2019-11-29 — End: 2019-11-30

## 2019-11-29 MED ORDER — SODIUM CHLORIDE 0.9 % IV BOLUS *I*
1000.0000 mL | Freq: Once | Status: AC
Start: 2019-11-29 — End: 2019-11-29
  Administered 2019-11-29: 1000 mL via INTRAVENOUS

## 2019-11-29 MED ORDER — MORPHINE SULFATE 4 MG/ML IV SOLN *WRAPPED*
4.0000 mg | Freq: Once | INTRAVENOUS | Status: DC
Start: 2019-11-29 — End: 2019-11-29
  Administered 2019-11-29: 4 mg via INTRAVENOUS
  Filled 2019-11-29: qty 1

## 2019-11-29 MED ORDER — HYDROMORPHONE HCL PF 1 MG/ML IJ SOLN *WRAPPED*
1.0000 mg | Freq: Once | INTRAMUSCULAR | Status: AC
Start: 2019-11-29 — End: 2019-11-29
  Administered 2019-11-29: 1 mg via INTRAVENOUS
  Filled 2019-11-29: qty 1

## 2019-11-29 MED ORDER — OXYCODONE HCL 5 MG PO TABS *I*
10.0000 mg | ORAL_TABLET | Freq: Once | ORAL | Status: AC
Start: 2019-11-29 — End: 2019-11-29
  Administered 2019-11-29: 10 mg via ORAL
  Filled 2019-11-29: qty 2

## 2019-11-29 MED ORDER — DEXTROSE 5 % FLUSH FOR PUMPS *I*
0.0000 mL/h | INTRAVENOUS | Status: DC | PRN
Start: 2019-11-29 — End: 2019-11-30

## 2019-11-29 MED ORDER — ONDANSETRON HCL 2 MG/ML IV SOLN *I*
4.0000 mg | Freq: Once | INTRAMUSCULAR | Status: AC
Start: 2019-11-29 — End: 2019-11-29
  Administered 2019-11-29: 4 mg via INTRAVENOUS
  Filled 2019-11-29: qty 2

## 2019-11-29 NOTE — ED Notes (Signed)
Pt to ED with c/o RLQ flank pain and RLQ abd pain that began Saturday evening. Positive for nausea. Pt endorses pain with urination and blood in urine. Pt hx of kidney stones. Pt states "this feels like the same thing." IV inserted, labs drawn, medicated per MAR.

## 2019-11-29 NOTE — ED Triage Notes (Signed)
Pt presents with c/o right sided abdominal pain and right flank pains since last night. Pain w/ urination. Hx of kidney stones. Pt endorses nausea. Denies fevers, changes to bowel habits.       Triage Note   Jacques Earthly, RN

## 2019-11-30 LAB — CHLAMYDIA PLASMID DNA AMPLIFICATION: Chlamydia Plasmid DNA Amplification: 0

## 2019-11-30 LAB — N. GONORRHOEAE DNA AMPLIFICATION: N. gonorrhoeae DNA Amplification: 0

## 2019-12-15 ENCOUNTER — Emergency Department: Payer: MEDICAID

## 2019-12-15 ENCOUNTER — Encounter: Payer: Self-pay | Admitting: Emergency Medicine

## 2019-12-15 ENCOUNTER — Emergency Department
Admission: EM | Admit: 2019-12-15 | Discharge: 2019-12-15 | Disposition: A | Discharge status: Home / Self Care | Payer: MEDICAID | Source: Ambulatory Visit | Attending: Emergency Medicine | Admitting: Emergency Medicine

## 2019-12-15 ENCOUNTER — Other Ambulatory Visit: Payer: Self-pay

## 2019-12-15 DIAGNOSIS — W11XXXA Fall on and from ladder, initial encounter: Secondary | ICD-10-CM

## 2019-12-15 DIAGNOSIS — Y9289 Other specified places as the place of occurrence of the external cause: Secondary | ICD-10-CM | POA: Insufficient documentation

## 2019-12-15 DIAGNOSIS — S3993XA Unspecified injury of pelvis, initial encounter: Secondary | ICD-10-CM

## 2019-12-15 DIAGNOSIS — Y998 Other external cause status: Secondary | ICD-10-CM | POA: Insufficient documentation

## 2019-12-15 DIAGNOSIS — W1839XA Other fall on same level, initial encounter: Secondary | ICD-10-CM | POA: Insufficient documentation

## 2019-12-15 DIAGNOSIS — Y990 Civilian activity done for income or pay: Secondary | ICD-10-CM

## 2019-12-15 DIAGNOSIS — Y9389 Activity, other specified: Secondary | ICD-10-CM | POA: Insufficient documentation

## 2019-12-15 DIAGNOSIS — M545 Low back pain: Secondary | ICD-10-CM | POA: Insufficient documentation

## 2019-12-15 DIAGNOSIS — S3992XA Unspecified injury of lower back, initial encounter: Secondary | ICD-10-CM | POA: Insufficient documentation

## 2019-12-15 MED ORDER — HYDROMORPHONE HCL PF 1 MG/ML IJ SOLN *WRAPPED*
1.0000 mg | Freq: Once | INTRAMUSCULAR | Status: AC
Start: 2019-12-15 — End: 2019-12-15
  Administered 2019-12-15: 1 mg via INTRAVENOUS
  Filled 2019-12-15: qty 1

## 2019-12-15 MED ORDER — HYDROMORPHONE HCL PF 1 MG/ML IJ SOLN *WRAPPED*
0.5000 mg | Freq: Once | INTRAMUSCULAR | Status: AC
Start: 2019-12-15 — End: 2019-12-15
  Administered 2019-12-15: 0.5 mg via INTRAVENOUS
  Filled 2019-12-15: qty 0.5

## 2019-12-15 MED ORDER — ONDANSETRON HCL 2 MG/ML IV SOLN *I*
4.0000 mg | Freq: Once | INTRAMUSCULAR | Status: AC
Start: 2019-12-15 — End: 2019-12-15
  Administered 2019-12-15: 4 mg via INTRAVENOUS
  Filled 2019-12-15: qty 2

## 2019-12-15 MED ORDER — SODIUM CHLORIDE 0.9 % IV BOLUS *I*
1000.0000 mL | Freq: Once | Status: AC
Start: 2019-12-15 — End: 2019-12-15
  Administered 2019-12-15: 1000 mL via INTRAVENOUS

## 2019-12-15 MED ORDER — SODIUM CHLORIDE 0.9 % FLUSH FOR PUMPS *I*
0.0000 mL/h | INTRAVENOUS | Status: DC | PRN
Start: 2019-12-15 — End: 2019-12-16

## 2019-12-15 MED ORDER — OXYCODONE HCL 5 MG PO TABS *I*
5.0000 mg | ORAL_TABLET | Freq: Four times a day (QID) | ORAL | 0 refills | Status: DC | PRN
Start: 2019-12-15 — End: 2019-12-23
  Filled 2019-12-15: qty 15, 4d supply, fill #0

## 2019-12-15 MED ORDER — DEXTROSE 5 % FLUSH FOR PUMPS *I*
0.0000 mL/h | INTRAVENOUS | Status: DC | PRN
Start: 2019-12-15 — End: 2019-12-16

## 2019-12-15 NOTE — Discharge Instructions (Addendum)
For severe breakthrough pain take oxycodone 5 mg every 6 hours.  Do not drink alcohol, take other drugs that make you sleepy, drive, operate machinery, or perform any activity that could cause harm to yourself or others if you are not alert.  Use ice or heat whichever helps  Rest  Return to the ED for fainting, difficulty breathing, weakness, numbness, numbness around genitals, difficulty urinating, abdominal pain or any concerns

## 2019-12-15 NOTE — ED Provider Notes (Signed)
History     Chief Complaint   Patient presents with    Back Injury     49 year old male presents with back pain.    He has a history of chronic low back pain status post fusion.  Today he was fixing a ceiling fan in his office when he fell backwards landing on his low back.  Since then he is having severe pain to his bilateral low back, constant, nonradiating, worse with movement.  Denies any weakness, numbness, saddle anesthesia, difficulty urinating.    Having hematuria intermittently prior to this injury, related to kidney stone per pt.          Medical/Surgical/Family History     Past Medical History:   Diagnosis Date    Chronic back pain greater than 3 months duration 01/08/2017    Current smoker 01/08/2017    Uncontrolled hypertension 01/08/2017        Patient Active Problem List   Diagnosis Code    Chronic back pain greater than 3 months duration M54.9, G89.29    Current smoker F17.200    Uncontrolled hypertension I10    Hemorrhagic cystitis - 10/2019 ER visit N30.91            Past Surgical History:   Procedure Laterality Date    APPENDECTOMY       No family history on file.       Social History     Tobacco Use    Smoking status: Not on file   Substance Use Topics    Alcohol use: Not on file    Drug use: Not on file     Living Situation     Questions Responses    Patient lives with     Homeless     Caregiver for other family member     External Services     Employment     Domestic Violence Risk                 Review of Systems   Review of Systems   Musculoskeletal: Positive for back pain.   Skin: Negative for wound.   Neurological: Negative for weakness and numbness.   Hematological: Does not bruise/bleed easily.       Physical Exam     Triage Vitals  Triage Start: Start, (12/15/19 1520)   First Recorded BP: 138/88, Resp: 16, Temp: 35.1 C (95.2 F), Temp src: TEMPORAL Oxygen Therapy SpO2: 98 %, Oximetry Source: Rt Hand, O2 Device: None (Room air), Heart Rate: (!) 112, (12/15/19 1524)  .  First  Pain Reported  0-10 Scale: 7, Pain Location/Orientation: Back, (12/15/19 1524)       Physical Exam  Vitals and nursing note reviewed.   Constitutional:       Appearance: Normal appearance.   HENT:      Head: Normocephalic.      Mouth/Throat:      Mouth: Mucous membranes are moist.   Eyes:      General: No scleral icterus.  Cardiovascular:      Rate and Rhythm: Normal rate and regular rhythm.      Pulses: Normal pulses.   Pulmonary:      Effort: Pulmonary effort is normal.      Breath sounds: Normal breath sounds.   Abdominal:      General: Abdomen is flat. There is no distension.      Tenderness: There is no abdominal tenderness.   Musculoskeletal:      Comments: Mildly tender  lumbar spine, tender bilateral paraspinal lumbar musculature, worse on the right side.  Mild bilateral pelvis tenderness.  No cervical or thoracic tenderness  normal strength and sensation to bilateral lower extremities.   Skin:     General: Skin is warm and dry.      Capillary Refill: Capillary refill takes less than 2 seconds.   Neurological:      Mental Status: He is alert and oriented to person, place, and time.   Psychiatric:         Mood and Affect: Mood normal.         Medical Decision Making   Patient seen by me on:  12/15/2019    Assessment:  49 year old male presents with bilateral low back pain after a fall backwards today.    Differential diagnosis:  Fracture, contusion  No anticoagulation or abdominal tenderness to suggest renal or intra-abdominal injury, and mechanism of injury was low speed    Plan:  X-ray lumbar spine and pelvis  Patient requesting IV analgesia and antiemetics  Dilaudid IV, Zofran IV, IV fluid rehydration    Independent review of: chart/prior records         ED Course as of Dec 14 2005   Wed Dec 15, 2019   2006 Pain improved. Will discharge with pcp follow up and return precautions.          Bluford Kaufmann, DO          Bluford Kaufmann, DO  12/15/19 2009

## 2019-12-15 NOTE — ED Triage Notes (Signed)
Fell 3-4 feet off of a ladder. C/o pain to lower back.       Triage Note   Donna Bernard, RN

## 2019-12-15 NOTE — ED Notes (Signed)
Pt being d/c to home. D/c instructions and follow up care reviewed, pt stated understanding. PIV removed. Appropriate clothing in place and pt has access to residence. Pt is being driven home by friend

## 2019-12-19 LAB — UNMAPPED LAB RESULTS
ABO RH Blood Type (HT): A POS — NL
Antibody Screen (HT): NEGATIVE — NL
Basophil # (HT): 0.1 10 3/uL — NL (ref 0.0–0.2)
Basophil % (HT): 1 % — NL (ref 0–3)
Eosinophil # (HT): 0.2 10 3/uL — NL (ref 0.0–0.6)
Eosinophil % (HT): 2 % — NL (ref 0–5)
Hematocrit (HT): 47 % — NL (ref 40–52)
Hematocrit (HT): 48 % — NL (ref 40–52)
Hemoglobin (HGB) (HT): 16.1 g/dL — NL (ref 13.0–18.0)
Hemoglobin (HGB) (HT): 16.1 g/dL — NL (ref 13.0–18.0)
Lymphocyte # (HT): 2.2 10 3/uL — NL (ref 1.0–4.8)
Lymphocyte % (HT): 18 % — NL (ref 15–45)
MCHC (HT): 34 g/dL — NL (ref 32.0–37.5)
MCHC (HT): 33.5 g/dL — NL (ref 32.0–37.5)
MCV (HT): 89 fL — NL (ref 80–100)
MCV (HT): 89 fL — NL (ref 80–100)
Mean Corpuscular Hemoglobin (MCH) (HT): 30.1 pg — NL (ref 26.0–34.0)
Mean Corpuscular Hemoglobin (MCH) (HT): 29.7 pg — NL (ref 26.0–34.0)
Monocyte # (HT): 0.8 10 3/uL — NL (ref 0.1–1.0)
Monocyte % (HT): 7 % — NL (ref 0–15)
Neutrophil # (HT): 8.8 10 3/uL — ABNORMAL HIGH (ref 1.8–8.0)
Platelets (HT): 160 10 3/uL — NL (ref 150–450)
Platelets (HT): 157 10 3/uL — NL (ref 150–450)
RBC (HT): 5.35 10 6/uL — NL (ref 4.40–6.20)
RBC (HT): 5.43 10 6/uL — NL (ref 4.40–6.20)
RDW (HT): 13.2 % — NL (ref 0.0–15.2)
RDW (HT): 13.2 % — NL (ref 0.0–15.2)
Seg Neut % (HT): 73 % — NL (ref 45–75)
WBC (HT): 12.1 10 3/uL — ABNORMAL HIGH (ref 4.0–11.0)
WBC (HT): 10.6 10 3/uL — NL (ref 4.0–11.0)

## 2019-12-20 HISTORY — PX: PTCA: SHX146

## 2019-12-20 LAB — UNMAPPED LAB RESULTS
Basophil # (HT): 0.1 10 3/uL — NL (ref 0.0–0.2)
Basophil % (HT): 1 % — NL (ref 0–3)
Eosinophil # (HT): 0.2 10 3/uL — NL (ref 0.0–0.6)
Eosinophil % (HT): 3 % — NL (ref 0–5)
Hematocrit (HT): 45 % — NL (ref 40–52)
Hematocrit (HT): 46 % — NL (ref 40–52)
Hemoglobin (HGB) (HT): 14.6 g/dL — NL (ref 13.0–18.0)
Hemoglobin (HGB) (HT): 14.9 g/dL — NL (ref 13.0–18.0)
Lymphocyte # (HT): 2.3 10 3/uL — NL (ref 1.0–4.8)
Lymphocyte % (HT): 26 % — NL (ref 15–45)
MCHC (HT): 32.5 g/dL — NL (ref 32.0–37.5)
MCHC (HT): 32.7 g/dL — NL (ref 32.0–37.5)
MCV (HT): 90 fL — NL (ref 80–100)
MCV (HT): 90 fL — NL (ref 80–100)
Mean Corpuscular Hemoglobin (MCH) (HT): 29.1 pg — NL (ref 26.0–34.0)
Mean Corpuscular Hemoglobin (MCH) (HT): 29.4 pg — NL (ref 26.0–34.0)
Monocyte # (HT): 0.6 10 3/uL — NL (ref 0.1–1.0)
Monocyte % (HT): 7 % — NL (ref 0–15)
Neutrophil # (HT): 5.5 10 3/uL — NL (ref 1.8–8.0)
Platelets (HT): 146 10 3/uL — ABNORMAL LOW (ref 150–450)
Platelets (HT): 146 10 3/uL — ABNORMAL LOW (ref 150–450)
RBC (HT): 5.01 10 6/uL — NL (ref 4.40–6.20)
RBC (HT): 5.06 10 6/uL — NL (ref 4.40–6.20)
RDW (HT): 13.5 % — NL (ref 0.0–15.2)
RDW (HT): 13.3 % — NL (ref 0.0–15.2)
Seg Neut % (HT): 63 % — NL (ref 45–75)
WBC (HT): 9.4 10 3/uL — NL (ref 4.0–11.0)
WBC (HT): 8.8 10 3/uL — NL (ref 4.0–11.0)

## 2019-12-21 LAB — UNMAPPED LAB RESULTS
Hematocrit (HT): 41 % — NL (ref 40–52)
Hematocrit (HT): 41 % — NL (ref 40–52)
Hemoglobin (HGB) (HT): 13.5 g/dL — NL (ref 13.0–18.0)
Hemoglobin (HGB) (HT): 13.8 g/dL — NL (ref 13.0–18.0)
MCHC (HT): 32.9 g/dL — NL (ref 32.0–37.5)
MCHC (HT): 33.5 g/dL — NL (ref 32.0–37.5)
MCV (HT): 90 fL — NL (ref 80–100)
MCV (HT): 89 fL — NL (ref 80–100)
Mean Corpuscular Hemoglobin (MCH) (HT): 29.5 pg — NL (ref 26.0–34.0)
Mean Corpuscular Hemoglobin (MCH) (HT): 29.9 pg — NL (ref 26.0–34.0)
Platelets (HT): 141 10 3/uL — ABNORMAL LOW (ref 150–450)
Platelets (HT): 142 10 3/uL — ABNORMAL LOW (ref 150–450)
RBC (HT): 4.57 10 6/uL — NL (ref 4.40–6.20)
RBC (HT): 4.61 10 6/uL — NL (ref 4.40–6.20)
RDW (HT): 13.4 % — NL (ref 0.0–15.2)
RDW (HT): 13.3 % — NL (ref 0.0–15.2)
WBC (HT): 8.3 10 3/uL — NL (ref 4.0–11.0)
WBC (HT): 7.2 10 3/uL — NL (ref 4.0–11.0)

## 2019-12-22 LAB — UNMAPPED LAB RESULTS
Hematocrit (HT): 40 % — NL (ref 40–52)
Hemoglobin (HGB) (HT): 13.1 g/dL — NL (ref 13.0–18.0)
MCHC (HT): 33 g/dL — NL (ref 32.0–37.5)
MCV (HT): 90 fL — NL (ref 80–100)
Mean Corpuscular Hemoglobin (MCH) (HT): 29.6 pg — NL (ref 26.0–34.0)
Platelets (HT): 126 10 3/uL — ABNORMAL LOW (ref 150–450)
RBC (HT): 4.42 10 6/uL — NL (ref 4.40–6.20)
RDW (HT): 13.1 % — NL (ref 0.0–15.2)
WBC (HT): 7.6 10 3/uL — NL (ref 4.0–11.0)

## 2019-12-23 ENCOUNTER — Telehealth: Payer: Self-pay | Admitting: Gastroenterology

## 2019-12-23 ENCOUNTER — Encounter: Payer: Self-pay | Admitting: Family Medicine

## 2019-12-23 ENCOUNTER — Telehealth: Payer: Self-pay

## 2019-12-23 ENCOUNTER — Ambulatory Visit: Payer: MEDICAID | Admitting: Family Medicine

## 2019-12-23 VITALS — BP 161/65 | HR 69 | Temp 97.4°F | Wt 175.0 lb

## 2019-12-23 DIAGNOSIS — M79605 Pain in left leg: Secondary | ICD-10-CM

## 2019-12-23 DIAGNOSIS — I82412 Acute embolism and thrombosis of left femoral vein: Secondary | ICD-10-CM

## 2019-12-23 DIAGNOSIS — Z7689 Persons encountering health services in other specified circumstances: Secondary | ICD-10-CM

## 2019-12-23 DIAGNOSIS — Z87891 Personal history of nicotine dependence: Secondary | ICD-10-CM

## 2019-12-23 MED ORDER — LISINOPRIL 5 MG PO TABS *I*
5.00 mg | ORAL_TABLET | Freq: Every day | ORAL | Status: DC
Start: 2019-12-23 — End: 2019-12-23

## 2019-12-23 MED ORDER — ONDANSETRON HCL 2 MG/ML IV SOLN *I*
4.00 mg | Freq: Four times a day (QID) | INTRAMUSCULAR | Status: DC | PRN
Start: ? — End: 2019-12-23

## 2019-12-23 MED ORDER — BISACODYL 10 MG RE SUPP *I*
10.00 mg | Freq: Every day | RECTAL | Status: DC | PRN
Start: ? — End: 2019-12-23

## 2019-12-23 MED ORDER — NALOXONE HCL 0.4 MG/ML IJ SOLN *WRAPPED*
0.10 mg | INTRAMUSCULAR | Status: DC | PRN
Start: ? — End: 2019-12-23

## 2019-12-23 MED ORDER — HEPARIN SODIUM (PORCINE) 5000 UNIT/ML IJ SOLN *I*
70.00 [IU]/kg | INTRAMUSCULAR | Status: DC | PRN
Start: ? — End: 2019-12-23

## 2019-12-23 MED ORDER — DOCUSATE SODIUM 100 MG PO CAPS *I*
100.00 mg | ORAL_CAPSULE | Freq: Two times a day (BID) | ORAL | Status: DC
Start: 2019-12-22 — End: 2019-12-23

## 2019-12-23 MED ORDER — ONDANSETRON 4 MG PO TBDP *I*
4.00 mg | ORAL_TABLET | Freq: Four times a day (QID) | ORAL | Status: DC | PRN
Start: ? — End: 2019-12-23

## 2019-12-23 MED ORDER — FAMOTIDINE (PF) 20 MG/2ML IV SOLN *I*
20.00 mg | Freq: Two times a day (BID) | INTRAVENOUS | Status: DC
Start: 2019-12-22 — End: 2019-12-23

## 2019-12-23 MED ORDER — MUPIROCIN 2 % EX OINT *I*
1.00 | TOPICAL_OINTMENT | Freq: Two times a day (BID) | CUTANEOUS | Status: DC
Start: 2019-12-22 — End: 2019-12-23

## 2019-12-23 MED ORDER — THIAMINE HCL 100 MG PO TABS *WRAPPED*
100.00 mg | ORAL_TABLET | Freq: Every day | ORAL | Status: DC
Start: 2019-12-23 — End: 2019-12-23

## 2019-12-23 NOTE — Telephone Encounter (Signed)
Patient was discharged from [facility] on 12/22/19.    Diagnosis: Blood Clots Left Leg    Appointment with Josetta Huddle on 12/23/19    Records requested Yes     I did request via fax the Medical Records be faxed to [place] today for his appointment.

## 2019-12-23 NOTE — Progress Notes (Signed)
Steve Wu is a 49 y.o. male is here today for   Chief Complaint   Patient presents with    Transition Care Managment(TCM)     LLE blood clot       Medications were reviewed today in Epic.    Allergies were reviewed and confirmed with patient.       Past Medical History:   Diagnosis Date    Chronic back pain greater than 3 months duration 01/08/2017    Current smoker 01/08/2017    Uncontrolled hypertension 01/08/2017        Current Outpatient Medications on File Prior to Visit   Medication Sig Dispense Refill    apixaban (ELIQUIS) 5 MG tablet Take 5 mg by mouth 2 times daily      lisinopril (PRINIVIL,ZESTRIL) 5 MG tablet Take 5 mg by mouth daily      oxybutynin (DITROPAN) 5 MG tablet Take 1 tablet (5 mg total) by mouth 3 times daily for 5 days 15 tablet 0    phenazopyridine (PYRIDIUM) 200 MG tablet Take 1 tablet (200 mg total) by mouth 3 times daily as needed for Pain (Patient not taking: Reported on 12/23/2019) 15 tablet 0     No current facility-administered medications on file prior to visit.        Allergies   Allergen Reactions    Ibuprofen Other (See Comments)    Tylenol [Acetaminophen] Itching         HPI:   He is here today for a transition of care visit after hospital discharge 2 days ago with DVT left lower extremity. He had extensive clotting in the entire left leg, so he underwent percutaneous ultrasound guided access of the left popliteal vein with placement of a catheter in the iliofemoral veins and venogram of the left lower extremity with interpretation of films. He then had catheter directed thrombolysis of the left popliteal vein, femoral vein, and iliac veins with TPA infusion.  He still has a lot of pain and swelling in the left leg, but he is managing at home. He is concerned he will run out of pain medicine over the weekend, as is requesting an additional supply. He is tolerating the medicines, but could not afford the zofran melts. The pain medicine does make him feel nauseated,  but so does the pain. So far he has been able to keep them down, but he hasn't been eating much because of the nausea.      Out of System Transitions Care Management Documentation:      Hospital Admission Date/Discharge Date: 12/19/2019-12/22/2019    Discharge Diagnosis at the time of discharge: LEFT LOWER LEG VENOGRAM, MECHANICAL THROMBOLYSIS WITH ANGIOJECT. BALLOON ANGIOPLASTY OF ILIAC, FEMORAL AND POPLITEAL VEIN     Hospital Area Discharged From: [facility]    Discharge Disposition: Home    Course of Hospital Stay:  Patient History: Patient is a 49 y.o. male admitted 12/19/2019 with complaint of back pain; primary diagnoses are Acute deep vein thrombosis (DVT) of femoral vein of left lower extremity (CMS HCC Code) [I82.412]       Medications: New, Changed, or Stopped:     Discharge Instructions  Medications at Time of Discharge  - documented as of this encounter  Medication Sig Dispensed Refills Start Date End Date   apixaban (ELIQUIS) 5 mg Oral Tab tab   Indications: Acute deep vein thrombosis (DVT) of femoral vein of left lower extremity (CMS HCC Code) Take 1 tablet by mouth 2 (two) times daily.  60 tablet  0 12/22/2019    lisinopriL 5 MG Oral tablet   Indications: Essential hypertension Take 1 tablet by mouth daily. 30 tablet  1 12/22/2019    ondansetron 4 MG Oral disintegrating tablet   Indications: Nausea Take 1 tablet by mouth every 6 (six) hours as needed. 10 tablet  0 12/22/2019    oxyCODONE 5 MG Oral immediate release tablet   Indications: Acute deep vein thrombosis (DVT) of femoral vein of left lower extremity (CMS HCC Code) Take 1 tablet by mouth every 8 (eight) hours as needed for up to 30 days. Max Daily Amount: 15 mg. 15 tablet  0 12/22/2019 01/21/2020       Future Appointments:  Josetta Huddle, NP on 12/23/2019 at 2:40 pm    PCP appointment scheduled: yes      Care Manager Interventions or Triage:       CONTACT 3: Patient seen in office within 48 hours of hospital discharge      Next  Appointment Date with PCP/CCP:     Care Manager comments, if applicable:      ROS As in HPI    Vitals:    12/23/19 1429   BP: 161/65   Pulse: 69   Temp: 36.3 C (97.4 F)   Weight: 79.4 kg (175 lb)         Health Maintenance Due   Topic Date Due    HIV Screening [facility]/[place]  Never done    Hepatitis C Screening  Never done    Colon Cancer Screening [facility]  Never done       Physical Exam  He appears well, but obviously uncomfortable. He rubs the left thigh during the entire visit, and frequently adjusts himself in the chair to an alternate position. I have 4 tablets of zofran available in stock, which I gave to him to tide him over the weekend. He had an insurance problem with the script that was sent in, and they weren't going to cover it. I also explained that they gave him a 5 day supply of the pain medicine, so he should not run out over the weekend if he is taking them as directed. He can call on Monday to get more if he still needs them.     Assesment/Plan:  1. Encounter for support and coordination of transition of care  Continue all present medications.     2. Acute deep vein thrombosis (DVT) of femoral vein of left lower extremity    3. Left leg pain  Call Monday for refill of pain medicine if still needed.     4. Former smoker    He has an appointment set up with Dr. Rushie Chestnut on 01/05/20.

## 2019-12-23 NOTE — Telephone Encounter (Signed)
Out of System Transitions Care Management Documentation:        Hospital Admission Date/Discharge Date: 12/19/2019-12/22/2019     Discharge Diagnosis at the time of discharge: LEFT LOWER LEG VENOGRAM, MECHANICAL THROMBOLYSIS WITH ANGIOJECT. BALLOON ANGIOPLASTY OF ILIAC, FEMORAL AND POPLITEAL VEIN      Hospital Area Discharged From: [facility]     Discharge Disposition: Home    Course of Hospital Stay:  Patient History: Patient is a 49 y.o. male admitted 12/19/2019 with complaint of back pain; primary diagnoses are Acute deep vein thrombosis (DVT) of femoral vein of left lower extremity (CMS HCC Code) [I82.412]        Medications: New, Changed, or Stopped:   Discharge Instructions  Medications at Time of Discharge  - documented as of this encounter  Medication Sig Dispensed Refills Start Date End Date   apixaban (ELIQUIS) 5 mg Oral Tab tab   Indications: Acute deep vein thrombosis (DVT) of femoral vein of left lower extremity (CMS HCC Code) Take 1 tablet by mouth 2 (two) times daily. 60 tablet  0 12/22/2019    lisinopriL 5 MG Oral tablet   Indications: Essential hypertension Take 1 tablet by mouth daily. 30 tablet  1 12/22/2019    ondansetron 4 MG Oral disintegrating tablet   Indications: Nausea Take 1 tablet by mouth every 6 (six) hours as needed. 10 tablet  0 12/22/2019    oxyCODONE 5 MG Oral immediate release tablet   Indications: Acute deep vein thrombosis (DVT) of femoral vein of left lower extremity (CMS HCC Code) Take 1 tablet by mouth every 8 (eight) hours as needed for up to 30 days. Max Daily Amount: 15 mg. 15 tablet  0 12/22/2019 01/21/2020       Future Appointments:  Josetta Huddle, NP on 12/23/2019 at 2:40 pm    PCP appointment scheduled: yes      Care Manager Interventions or Triage:       CONTACT 3: Patient seen in office within 48 hours of hospital discharge      Next Appointment Date with PCP/CCP:     Care Manager comments, if applicable:

## 2019-12-26 NOTE — ED Provider Notes (Signed)
History     Chief Complaint   Patient presents with    Flank Pain     48 yM here with right sided abdominal/flank pain. H/o stones, this feels similar. Recent workups have been negative however.          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       Social History     Tobacco Use    Smoking status: Former Smoker    Smokeless tobacco: Former Estate agent Use Topics    Alcohol use: Not on file    Drug use: Not on file     Living Situation     Questions Responses    Patient lives with     Homeless     Caregiver for other family member     External Services     Employment     Domestic Violence Risk                 Review of Systems   Review of Systems   All other systems reviewed and are negative.      Physical Exam     Triage Vitals  Triage Start: Start, (11/29/19 1630)   First Recorded BP: (!) 185/105, Resp: 16, Temp: 36.1 C (97 F), Temp src: TEMPORAL Oxygen Therapy SpO2: 99 %, Oximetry Source: Rt Hand, O2 Device: None (Room air), Heart Rate: 99, (11/29/19 1631)  .  First Pain Reported  0-10 Scale: 6, Pain Location/Orientation: Flank Right, (11/29/19 1631)       Physical Exam  Vitals and nursing note reviewed.   Constitutional:       General: He is not in acute distress.     Appearance: He is well-developed.      Comments: Pacing around uncomfortably   HENT:      Head: Normocephalic and  atraumatic.   Eyes:      Pupils: Pupils are equal, round, and reactive to light.   Cardiovascular:      Rate and Rhythm: Normal rate and regular rhythm.      Heart sounds: No murmur heard.      Pulmonary:      Effort: Pulmonary effort is normal. No respiratory distress.      Breath sounds: Normal breath sounds.   Abdominal:      General: There is no distension.      Palpations: Abdomen is soft.      Tenderness: There is no abdominal tenderness.   Musculoskeletal:         General: Normal range of motion.      Cervical back: Normal range of motion and neck supple.   Skin:     General: Skin  is warm and dry.      Findings: No rash.   Neurological:      Mental Status: He is alert and oriented to person, place, and time.      Cranial Nerves: No cranial nerve deficit.         Medical Decision Making   Patient seen by me on:  11/29/2019    Assessment:  48 yM here with flank pain    Differential diagnosis:  Msk pain, kidney stone    Plan:  Orders Placed This Encounter      N. Gonorrhoeae DNA amplification      Chlamydia plasmid DNA amplification      Urinalysis reflex to culture      US renal retroperitoneal limited      CBC and differential      Basic metabolic panel      RUQ panel (ED only)      Urinalysis with reflex to Microscopic UA and reflex to Bacterial Culture      Urine microscopic ([id number])      ED/UC REFERRAL TO UROLOGY      POCT urinalysis dipstick      Insert peripheral IV                  Gaspar Cola, MD          Gaspar Cola, MD  12/26/19 [phone]

## 2019-12-27 ENCOUNTER — Other Ambulatory Visit: Payer: Self-pay | Admitting: Family Medicine

## 2019-12-27 DIAGNOSIS — M79605 Pain in left leg: Secondary | ICD-10-CM

## 2019-12-27 DIAGNOSIS — I82412 Acute embolism and thrombosis of left femoral vein: Secondary | ICD-10-CM | POA: Insufficient documentation

## 2019-12-27 LAB — UNMAPPED LAB RESULTS
Basophil # (HT): 0.1 10 3/uL — NL (ref 0.0–0.2)
Basophil % (HT): 1 % — NL (ref 0–3)
Eosinophil # (HT): 0.3 10 3/uL — NL (ref 0.1–0.6)
Eosinophil % (HT): 3 % — NL (ref 0–5)
Hematocrit (HT): 44 % — NL (ref 40–52)
Hemoglobin (HGB) (HT): 15.1 g/dL — NL (ref 13.0–18.0)
Lymphocyte # (HT): 2.8 10 3/uL — NL (ref 1.0–4.8)
Lymphocyte % (HT): 25 % — NL (ref 15–45)
MCHC (HT): 34.2 g/dL — NL (ref 32.0–37.5)
MCV (HT): 88 fL — NL (ref 80–100)
Mean Corpuscular Hemoglobin (MCH) (HT): 30 pg — NL (ref 26.0–34.0)
Monocyte # (HT): 0.7 10 3/uL — NL (ref 0.1–1.0)
Monocyte % (HT): 6 % — NL (ref 0–15)
Neutrophil # (HT): 7.2 10 3/uL — NL (ref 1.8–8.0)
Platelets (HT): 270 10 3/uL — NL (ref 150–450)
RBC (HT): 5.04 10 6/uL — NL (ref 4.40–6.20)
RDW (HT): 13.2 % — NL (ref 9.0–15.2)
Seg Neut % (HT): 64 % — NL (ref 45–75)
WBC (HT): 11.2 10 3/uL — ABNORMAL HIGH (ref 4.0–11.0)

## 2019-12-27 MED ORDER — OXYBUTYNIN CHLORIDE 5 MG PO TABS *I*
5.0000 mg | ORAL_TABLET | Freq: Three times a day (TID) | ORAL | 0 refills | Status: DC
Start: 2019-12-27 — End: 2020-01-17

## 2019-12-28 ENCOUNTER — Encounter: Payer: Self-pay | Admitting: Family Medicine

## 2019-12-28 MED ORDER — OXYCODONE HCL 5 MG PO TABS *I*
5.0000 mg | ORAL_TABLET | ORAL | 0 refills | Status: AC | PRN
Start: 2019-12-28 — End: 2020-01-02

## 2019-12-28 NOTE — Addendum Note (Signed)
Addended by: Josetta Huddle on: 12/28/2019 05:04 PM     Modules accepted: Orders

## 2019-12-28 NOTE — Telephone Encounter (Signed)
Patient calls stating he actually is requesting oxycodone, (not the oxyubutin) that he spoke about with Raynelle Fanning last week at his appt.    He stats they gave him 15 and now is requesting more per the office visit.    This med is not on his list currently.    Pleas advise.

## 2019-12-28 NOTE — Telephone Encounter (Signed)
Pt calling agin due to oxycodone script.

## 2019-12-28 NOTE — Telephone Encounter (Signed)
Script sent as requested. 

## 2019-12-29 ENCOUNTER — Encounter: Payer: Self-pay | Admitting: Family Medicine

## 2019-12-30 DIAGNOSIS — R0781 Pleurodynia: Secondary | ICD-10-CM | POA: Insufficient documentation

## 2019-12-30 DIAGNOSIS — Z7901 Long term (current) use of anticoagulants: Secondary | ICD-10-CM | POA: Insufficient documentation

## 2019-12-30 LAB — UNMAPPED LAB RESULTS
Basophil # (HT): 0.1 10 3/uL — NL (ref 0.0–0.2)
Basophil % (HT): 1 % — NL (ref 0–3)
Eosinophil # (HT): 0.2 10 3/uL — NL (ref 0.0–0.6)
Eosinophil % (HT): 2 % — NL (ref 0–5)
Hematocrit (HT): 44 % — NL (ref 40–52)
Hematocrit (HT): 45 % — NL (ref 40–52)
Hemoglobin (HGB) (HT): 14.6 g/dL — NL (ref 13.0–18.0)
Hemoglobin (HGB) (HT): 15 g/dL — NL (ref 13.0–18.0)
Lymphocyte # (HT): 2.2 10 3/uL — NL (ref 1.0–4.8)
Lymphocyte % (HT): 20 % — NL (ref 15–45)
MCHC (HT): 33.3 g/dL — NL (ref 32.0–37.5)
MCHC (HT): 33.3 g/dL — NL (ref 32.0–37.5)
MCV (HT): 89 fL — NL (ref 80–100)
MCV (HT): 89 fL — NL (ref 80–100)
Mean Corpuscular Hemoglobin (MCH) (HT): 29.6 pg — NL (ref 26.0–34.0)
Mean Corpuscular Hemoglobin (MCH) (HT): 29.6 pg — NL (ref 26.0–34.0)
Monocyte # (HT): 0.7 10 3/uL — NL (ref 0.1–1.0)
Monocyte % (HT): 6 % — NL (ref 0–15)
Neutrophil # (HT): 7.6 10 3/uL — NL (ref 1.8–8.0)
Platelets (HT): 199 10 3/uL — NL (ref 150–450)
Platelets (HT): 218 10 3/uL — NL (ref 150–450)
RBC (HT): 4.94 10 6/uL — NL (ref 4.40–6.20)
RBC (HT): 5.06 10 6/uL — NL (ref 4.40–6.20)
RDW (HT): 13.5 % — NL (ref 0.0–15.2)
RDW (HT): 13.6 % — NL (ref 0.0–15.2)
Seg Neut % (HT): 70 % — NL (ref 45–75)
WBC (HT): 11 10 3/uL — NL (ref 4.0–11.0)
WBC (HT): 10.8 10 3/uL — NL (ref 4.0–11.0)

## 2019-12-31 LAB — UNMAPPED LAB RESULTS
ABO RH Blood Type (HT): A POS — NL
Antibody Screen (HT): NEGATIVE — NL
Basophil # (HT): 0.1 10 3/uL — NL (ref 0.0–0.2)
Basophil % (HT): 1 % — NL (ref 0–3)
Eosinophil # (HT): 0.3 10 3/uL — NL (ref 0.0–0.6)
Eosinophil % (HT): 3 % — NL (ref 0–5)
Hematocrit (HT): 43 % — NL (ref 40–52)
Hemoglobin (HGB) (HT): 13.9 g/dL — NL (ref 13.0–18.0)
Lymphocyte # (HT): 2.7 10 3/uL — NL (ref 1.0–4.8)
Lymphocyte % (HT): 28 % — NL (ref 15–45)
MCHC (HT): 32.4 g/dL — NL (ref 32.0–37.5)
MCV (HT): 91 fL — NL (ref 80–100)
Mean Corpuscular Hemoglobin (MCH) (HT): 29.3 pg — NL (ref 26.0–34.0)
Monocyte # (HT): 0.9 10 3/uL — NL (ref 0.1–1.0)
Monocyte % (HT): 9 % — NL (ref 0–15)
Neutrophil # (HT): 5.8 10 3/uL — NL (ref 1.8–8.0)
Platelets (HT): 210 10 3/uL — NL (ref 150–450)
RBC (HT): 4.74 10 6/uL — NL (ref 4.40–6.20)
RDW (HT): 13.7 % — NL (ref 0.0–15.2)
Seg Neut % (HT): 59 % — NL (ref 45–75)
WBC (HT): 9.9 10 3/uL — NL (ref 4.0–11.0)

## 2020-01-01 LAB — UNMAPPED LAB RESULTS
Hematocrit (HT): 41 % — NL (ref 40–52)
Hemoglobin (HGB) (HT): 13.2 g/dL — NL (ref 13.0–18.0)
MCHC (HT): 32.3 g/dL — NL (ref 32.0–37.5)
MCV (HT): 91 fL — NL (ref 80–100)
Mean Corpuscular Hemoglobin (MCH) (HT): 29.3 pg — NL (ref 26.0–34.0)
Platelets (HT): 195 10 3/uL — NL (ref 150–450)
RBC (HT): 4.5 10 6/uL — NL (ref 4.40–6.20)
RDW (HT): 13.6 % — NL (ref 0.0–15.2)
WBC (HT): 8.5 10 3/uL — NL (ref 4.0–11.0)

## 2020-01-02 LAB — UNMAPPED LAB RESULTS
Basophil # (HT): 0 10 3/uL — NL (ref 0.0–0.2)
Basophil % (HT): 0 % — NL (ref 0–3)
Eosinophil # (HT): 0.3 10 3/uL — NL (ref 0.0–0.6)
Eosinophil % (HT): 4 % — NL (ref 0–5)
Hematocrit (HT): 40 % — NL (ref 40–52)
Hemoglobin (HGB) (HT): 13 g/dL — NL (ref 13.0–18.0)
Lymphocyte # (HT): 1.8 10 3/uL — NL (ref 1.0–4.8)
Lymphocyte % (HT): 19 % — NL (ref 15–45)
MCHC (HT): 32.3 g/dL — NL (ref 32.0–37.5)
MCV (HT): 91 fL — NL (ref 80–100)
Mean Corpuscular Hemoglobin (MCH) (HT): 29.5 pg — NL (ref 26.0–34.0)
Monocyte # (HT): 0.7 10 3/uL — NL (ref 0.1–1.0)
Monocyte % (HT): 7 % — NL (ref 0–15)
Neutrophil # (HT): 6.6 10 3/uL — NL (ref 1.8–8.0)
Platelets (HT): 207 10 3/uL — NL (ref 150–450)
RBC (HT): 4.4 10 6/uL — NL (ref 4.40–6.20)
RDW (HT): 13.6 % — NL (ref 0.0–15.2)
Seg Neut % (HT): 69 % — NL (ref 45–75)
WBC (HT): 9.5 10 3/uL — NL (ref 4.0–11.0)

## 2020-01-03 LAB — UNMAPPED LAB RESULTS
Basophil # (HT): 0 10 3/uL — NL (ref 0.0–0.2)
Basophil % (HT): 1 % — NL (ref 0–3)
Eosinophil # (HT): 0.4 10 3/uL — NL (ref 0.0–0.6)
Eosinophil % (HT): 6 % — ABNORMAL HIGH (ref 0–5)
Hematocrit (HT): 39 % — ABNORMAL LOW (ref 40–52)
Hemoglobin (HGB) (HT): 12.8 g/dL — ABNORMAL LOW (ref 13.0–18.0)
Lymphocyte # (HT): 2.4 10 3/uL — NL (ref 1.0–4.8)
Lymphocyte % (HT): 34 % — NL (ref 15–45)
MCHC (HT): 33 g/dL — NL (ref 32.0–37.5)
MCV (HT): 92 fL — NL (ref 80–100)
Mean Corpuscular Hemoglobin (MCH) (HT): 30.3 pg — NL (ref 26.0–34.0)
Monocyte # (HT): 0.6 10 3/uL — NL (ref 0.1–1.0)
Monocyte % (HT): 8 % — NL (ref 0–15)
Neutrophil # (HT): 3.6 10 3/uL — NL (ref 1.8–8.0)
Platelets (HT): 212 10 3/uL — NL (ref 150–450)
RBC (HT): 4.22 10 6/uL — ABNORMAL LOW (ref 4.40–6.20)
RDW (HT): 13.6 % — NL (ref 0.0–15.2)
Seg Neut % (HT): 50 % — NL (ref 45–75)
WBC (HT): 7.1 10 3/uL — NL (ref 4.0–11.0)

## 2020-01-04 ENCOUNTER — Encounter: Payer: Self-pay | Admitting: Family Medicine

## 2020-01-04 ENCOUNTER — Telehealth: Payer: Self-pay | Admitting: Family Medicine

## 2020-01-04 ENCOUNTER — Other Ambulatory Visit: Payer: Self-pay | Admitting: Gastroenterology

## 2020-01-04 LAB — UNMAPPED LAB RESULTS
ABO RH Blood Type (HT): A POS — NL
Antibody Screen (HT): NEGATIVE — NL
Basophil # (HT): 0.1 10 3/uL — NL (ref 0.0–0.2)
Basophil % (HT): 1 % — NL (ref 0–3)
Eosinophil # (HT): 0.4 10 3/uL — NL (ref 0.0–0.6)
Eosinophil % (HT): 5 % — NL (ref 0–5)
Hematocrit (HT): 42 % — NL (ref 40–52)
Hemoglobin (HGB) (HT): 13.7 g/dL — NL (ref 13.0–18.0)
Lymphocyte # (HT): 2.1 10 3/uL — NL (ref 1.0–4.8)
Lymphocyte % (HT): 26 % — NL (ref 15–45)
MCHC (HT): 32.9 g/dL — NL (ref 32.0–37.5)
MCV (HT): 92 fL — NL (ref 80–100)
Mean Corpuscular Hemoglobin (MCH) (HT): 30.3 pg — NL (ref 26.0–34.0)
Monocyte # (HT): 0.6 10 3/uL — NL (ref 0.1–1.0)
Monocyte % (HT): 8 % — NL (ref 0–15)
Neutrophil # (HT): 4.7 10 3/uL — NL (ref 1.8–8.0)
Platelets (HT): 202 10 3/uL — NL (ref 150–450)
RBC (HT): 4.52 10 6/uL — NL (ref 4.40–6.20)
RDW (HT): 13.7 % — NL (ref 0.0–15.2)
Seg Neut % (HT): 59 % — NL (ref 45–75)
WBC (HT): 7.9 10 3/uL — NL (ref 4.0–11.0)

## 2020-01-04 NOTE — Telephone Encounter (Signed)
Patient was discharged from [facility] on Possibly 01/09/20.    Diagnosis: blood clots    Appointment with Dr. Rushie Chestnut on 01/11/20    Records requested No       Patient's friend called to reschedule tomorrow's apt as he is having surgery today. She states he should be released this weekend and wanted to schedule for next week. She will let us know if he doesn't get discharged.

## 2020-01-05 ENCOUNTER — Ambulatory Visit: Payer: Medicaid (Managed Care) | Admitting: Family Medicine

## 2020-01-05 HISTORY — PX: THROMBECTOMY: SHX45

## 2020-01-05 LAB — UNMAPPED LAB RESULTS
Basophil # (HT): 0 10 3/uL — NL (ref 0.0–0.2)
Basophil % (HT): 0 % — NL (ref 0–3)
Eosinophil # (HT): 0 10 3/uL — NL (ref 0.0–0.6)
Eosinophil % (HT): 0 % — NL (ref 0–5)
Hematocrit (HT): 40 % — NL (ref 40–52)
Hematocrit (HT): 40 % — NL (ref 40–52)
Hematocrit (HT): 41 % — NL (ref 40–52)
Hemoglobin (HGB) (HT): 13 g/dL — NL (ref 13.0–18.0)
Hemoglobin (HGB) (HT): 13.2 g/dL — NL (ref 13.0–18.0)
Lymphocyte # (HT): 0.8 10 3/uL — ABNORMAL LOW (ref 1.0–4.8)
Lymphocyte % (HT): 6 % — ABNORMAL LOW (ref 15–45)
MCHC (HT): 32.7 g/dL — NL (ref 32.0–37.5)
MCHC (HT): 32.8 g/dL — NL (ref 32.0–37.5)
MCV (HT): 91 fL — NL (ref 80–100)
MCV (HT): 92 fL — NL (ref 80–100)
Mean Corpuscular Hemoglobin (MCH) (HT): 29.7 pg — NL (ref 26.0–34.0)
Mean Corpuscular Hemoglobin (MCH) (HT): 30.1 pg — NL (ref 26.0–34.0)
Monocyte # (HT): 0.6 10 3/uL — NL (ref 0.1–1.0)
Monocyte % (HT): 4 % — NL (ref 0–15)
Neutrophil # (HT): 13.7 10 3/uL — ABNORMAL HIGH (ref 1.8–8.0)
Platelets (HT): 216 10 3/uL — NL (ref 150–450)
Platelets (HT): 223 10 3/uL — NL (ref 150–450)
RBC (HT): 4.37 10 6/uL — ABNORMAL LOW (ref 4.40–6.20)
RBC (HT): 4.39 10 6/uL — ABNORMAL LOW (ref 4.40–6.20)
RDW (HT): 13.4 % — NL (ref 0.0–15.2)
RDW (HT): 13.4 % — NL (ref 0.0–15.2)
Seg Neut % (HT): 89 % — ABNORMAL HIGH (ref 45–75)
WBC (HT): 14.7 10 3/uL — ABNORMAL HIGH (ref 4.0–11.0)
WBC (HT): 15.3 10 3/uL — ABNORMAL HIGH (ref 4.0–11.0)

## 2020-01-06 LAB — UNMAPPED LAB RESULTS
Basophil # (HT): 0 10 3/uL — NL (ref 0.0–0.2)
Basophil % (HT): 0 % — NL (ref 0–3)
Eosinophil # (HT): 0 10 3/uL — NL (ref 0.0–0.6)
Eosinophil % (HT): 0 % — NL (ref 0–5)
Hematocrit (HT): 31 % — ABNORMAL LOW (ref 40–52)
Hematocrit (HT): 32 % — ABNORMAL LOW (ref 40–52)
Hematocrit (HT): 34 % — ABNORMAL LOW (ref 40–52)
Hemoglobin (HGB) (HT): 10.1 g/dL — ABNORMAL LOW (ref 13.0–18.0)
Hemoglobin (HGB) (HT): 10.3 g/dL — ABNORMAL LOW (ref 13.0–18.0)
Hemoglobin (HGB) (HT): 11.3 g/dL — ABNORMAL LOW (ref 13.0–18.0)
Lymphocyte # (HT): 1.4 10 3/uL — NL (ref 1.0–4.8)
Lymphocyte % (HT): 8 % — ABNORMAL LOW (ref 15–45)
MCHC (HT): 33 g/dL — NL (ref 32.0–37.5)
MCV (HT): 90 fL — NL (ref 80–100)
Mean Corpuscular Hemoglobin (MCH) (HT): 29.6 pg — NL (ref 26.0–34.0)
Monocyte # (HT): 1.3 10 3/uL — ABNORMAL HIGH (ref 0.1–1.0)
Monocyte % (HT): 8 % — NL (ref 0–15)
Neutrophil # (HT): 14.3 10 3/uL — ABNORMAL HIGH (ref 1.8–8.0)
Platelets (HT): 195 10 3/uL — NL (ref 150–450)
RBC (HT): 3.82 10 6/uL — ABNORMAL LOW (ref 4.40–6.20)
RDW (HT): 13.7 % — NL (ref 0.0–15.2)
Seg Neut % (HT): 84 % — ABNORMAL HIGH (ref 45–75)
WBC (HT): 17.1 10 3/uL — ABNORMAL HIGH (ref 4.0–11.0)

## 2020-01-07 LAB — UNMAPPED LAB RESULTS
ABO RH Blood Type (HT): A POS — NL
Antibody Screen (HT): NEGATIVE — NL
Basophil # (HT): 0 10 3/uL — NL (ref 0.0–0.2)
Basophil % (HT): 0 % — NL (ref 0–3)
Eosinophil # (HT): 0.2 10 3/uL — NL (ref 0.0–0.6)
Eosinophil % (HT): 2 % — NL (ref 0–5)
Hematocrit (HT): 29 % — ABNORMAL LOW (ref 40–52)
Hematocrit (HT): 28 % — ABNORMAL LOW (ref 40–52)
Hematocrit (HT): 27 % — ABNORMAL LOW (ref 40–52)
Hemoglobin (HGB) (HT): 9.5 g/dL — ABNORMAL LOW (ref 13.0–18.0)
Hemoglobin (HGB) (HT): 9.5 g/dL — ABNORMAL LOW (ref 13.0–18.0)
Hemoglobin (HGB) (HT): 8.8 g/dL — ABNORMAL LOW (ref 13.0–18.0)
Lymphocyte # (HT): 1.6 10 3/uL — NL (ref 1.0–4.8)
Lymphocyte % (HT): 15 % — NL (ref 15–45)
MCHC (HT): 32.3 g/dL — NL (ref 32.0–37.5)
MCV (HT): 92 fL — NL (ref 80–100)
Mean Corpuscular Hemoglobin (MCH) (HT): 29.8 pg — NL (ref 26.0–34.0)
Monocyte # (HT): 1.1 10 3/uL — ABNORMAL HIGH (ref 0.1–1.0)
Monocyte % (HT): 10 % — NL (ref 0–15)
Neutrophil # (HT): 7.6 10 3/uL — NL (ref 1.8–8.0)
Platelets (HT): 162 10 3/uL — NL (ref 150–450)
RBC (HT): 3.19 10 6/uL — ABNORMAL LOW (ref 4.40–6.20)
RDW (HT): 13.7 % — NL (ref 0.0–15.2)
Seg Neut % (HT): 72 % — NL (ref 45–75)
WBC (HT): 10.6 10 3/uL — NL (ref 4.0–11.0)

## 2020-01-08 LAB — UNMAPPED LAB RESULTS
Basophil # (HT): 0 10 3/uL — NL (ref 0.0–0.2)
Basophil % (HT): 0 % — NL (ref 0–3)
Eosinophil # (HT): 0.2 10 3/uL — NL (ref 0.0–0.6)
Eosinophil % (HT): 2 % — NL (ref 0–5)
Hematocrit (HT): 28 % — ABNORMAL LOW (ref 40–52)
Hematocrit (HT): 28 % — ABNORMAL LOW (ref 40–52)
Hemoglobin (HGB) (HT): 9.3 g/dL — ABNORMAL LOW (ref 13.0–18.0)
Hemoglobin (HGB) (HT): 9.3 g/dL — ABNORMAL LOW (ref 13.0–18.0)
Lymphocyte # (HT): 1.1 10 3/uL — NL (ref 1.0–4.8)
Lymphocyte % (HT): 13 % — ABNORMAL LOW (ref 15–45)
MCHC (HT): 32.7 g/dL — NL (ref 32.0–37.5)
MCV (HT): 93 fL — NL (ref 80–100)
Mean Corpuscular Hemoglobin (MCH) (HT): 30.3 pg — NL (ref 26.0–34.0)
Monocyte # (HT): 0.8 10 3/uL — NL (ref 0.1–1.0)
Monocyte % (HT): 9 % — NL (ref 0–15)
Neutrophil # (HT): 6.2 10 3/uL — NL (ref 1.8–8.0)
Platelets (HT): 170 10 3/uL — NL (ref 150–450)
RBC (HT): 3.07 10 6/uL — ABNORMAL LOW (ref 4.40–6.20)
RDW (HT): 13.2 % — NL (ref 0.0–15.2)
Seg Neut % (HT): 74 % — NL (ref 45–75)
WBC (HT): 8.3 10 3/uL — NL (ref 4.0–11.0)

## 2020-01-09 LAB — UNMAPPED LAB RESULTS
Basophil # (HT): 0.1 10 3/uL — NL (ref 0.0–0.2)
Basophil % (HT): 1 % — NL (ref 0–3)
Eosinophil # (HT): 0.3 10 3/uL — NL (ref 0.0–0.6)
Eosinophil % (HT): 3 % — NL (ref 0–5)
Hematocrit (HT): 29 % — ABNORMAL LOW (ref 40–52)
Hemoglobin (HGB) (HT): 9.4 g/dL — ABNORMAL LOW (ref 13.0–18.0)
Lymphocyte # (HT): 1.3 10 3/uL — NL (ref 1.0–4.8)
Lymphocyte % (HT): 13 % — ABNORMAL LOW (ref 15–45)
MCHC (HT): 32.1 g/dL — NL (ref 32.0–37.5)
MCV (HT): 92 fL — NL (ref 80–100)
Mean Corpuscular Hemoglobin (MCH) (HT): 29.7 pg — NL (ref 26.0–34.0)
Monocyte # (HT): 0.8 10 3/uL — NL (ref 0.1–1.0)
Monocyte % (HT): 8 % — NL (ref 0–15)
Neutrophil # (HT): 7.1 10 3/uL — NL (ref 1.8–8.0)
Platelets (HT): 214 10 3/uL — NL (ref 150–450)
RBC (HT): 3.17 10 6/uL — ABNORMAL LOW (ref 4.40–6.20)
RDW (HT): 13.2 % — NL (ref 0.0–15.2)
Seg Neut % (HT): 75 % — NL (ref 45–75)
WBC (HT): 9.5 10 3/uL — NL (ref 4.0–11.0)

## 2020-01-10 ENCOUNTER — Encounter: Payer: Self-pay | Admitting: Family Medicine

## 2020-01-10 DIAGNOSIS — I671 Cerebral aneurysm, nonruptured: Secondary | ICD-10-CM | POA: Insufficient documentation

## 2020-01-10 DIAGNOSIS — I871 Compression of vein: Secondary | ICD-10-CM | POA: Insufficient documentation

## 2020-01-10 DIAGNOSIS — K5790 Diverticulosis of intestine, part unspecified, without perforation or abscess without bleeding: Secondary | ICD-10-CM | POA: Insufficient documentation

## 2020-01-10 LAB — UNMAPPED LAB RESULTS
ABO RH Blood Type (HT): A POS — NL
Antibody Screen (HT): NEGATIVE — NL
Basophil # (HT): 0.1 10 3/uL — NL (ref 0.0–0.2)
Basophil % (HT): 1 % — NL (ref 0–3)
Eosinophil # (HT): 0.5 10 3/uL — NL (ref 0.0–0.6)
Eosinophil % (HT): 5 % — NL (ref 0–5)
Hematocrit (HT): 22 % — ABNORMAL LOW (ref 40–52)
Hematocrit (HT): 28 % — ABNORMAL LOW (ref 40–52)
Hemoglobin (HGB) (HT): 7.2 g/dL — ABNORMAL LOW (ref 13.0–18.0)
Hemoglobin (HGB) (HT): 9.5 g/dL — ABNORMAL LOW (ref 13.0–18.0)
Lymphocyte # (HT): 1.9 10 3/uL — NL (ref 1.0–4.8)
Lymphocyte % (HT): 21 % — NL (ref 15–45)
MCHC (HT): 32.7 g/dL — NL (ref 32.0–37.5)
MCV (HT): 92 fL — NL (ref 80–100)
Mean Corpuscular Hemoglobin (MCH) (HT): 30 pg — NL (ref 26.0–34.0)
Monocyte # (HT): 0.9 10 3/uL — NL (ref 0.1–1.0)
Monocyte % (HT): 11 % — NL (ref 0–15)
Neutrophil # (HT): 5.4 10 3/uL — NL (ref 1.8–8.0)
Platelets (HT): 218 10 3/uL — NL (ref 150–450)
RBC (HT): 2.4 10 6/uL — ABNORMAL LOW (ref 4.40–6.20)
RDW (HT): 13.2 % — NL (ref 0.0–15.2)
Seg Neut % (HT): 62 % — NL (ref 45–75)
WBC (HT): 8.7 10 3/uL — NL (ref 4.0–11.0)

## 2020-01-11 ENCOUNTER — Encounter: Payer: Medicaid (Managed Care) | Admitting: Family Medicine

## 2020-01-11 ENCOUNTER — Other Ambulatory Visit: Payer: Self-pay | Admitting: Gastroenterology

## 2020-01-11 LAB — UNMAPPED LAB RESULTS
Basophil # (HT): 0 10 3/uL — NL (ref 0.0–0.2)
Basophil % (HT): 1 % — NL (ref 0–3)
Eosinophil # (HT): 0.4 10 3/uL — NL (ref 0.0–0.6)
Eosinophil % (HT): 5 % — NL (ref 0–5)
Hematocrit (HT): 27 % — ABNORMAL LOW (ref 40–52)
Hemoglobin (HGB) (HT): 8.7 g/dL — ABNORMAL LOW (ref 13.0–18.0)
Lymphocyte # (HT): 1.9 10 3/uL — NL (ref 1.0–4.8)
Lymphocyte % (HT): 25 % — NL (ref 15–45)
MCHC (HT): 31.8 g/dL — ABNORMAL LOW (ref 32.0–37.5)
MCV (HT): 92 fL — NL (ref 80–100)
Mean Corpuscular Hemoglobin (MCH) (HT): 29.2 pg — NL (ref 26.0–34.0)
Monocyte # (HT): 0.8 10 3/uL — NL (ref 0.1–1.0)
Monocyte % (HT): 10 % — NL (ref 0–15)
Neutrophil # (HT): 4.3 10 3/uL — NL (ref 1.8–8.0)
Platelets (HT): 221 10 3/uL — NL (ref 150–450)
RBC (HT): 2.98 10 6/uL — ABNORMAL LOW (ref 4.40–6.20)
RDW (HT): 13.2 % — NL (ref 0.0–15.2)
Seg Neut % (HT): 58 % — NL (ref 45–75)
WBC (HT): 7.4 10 3/uL — NL (ref 4.0–11.0)

## 2020-01-12 ENCOUNTER — Telehealth: Payer: Self-pay

## 2020-01-12 NOTE — Telephone Encounter (Signed)
Out of System Transitions Care Management Documentation:        Hospital Admission Date/Discharge Date: 12/30/2019-01/11/2020     Discharge Diagnosis at the time of discharge:  Leg DVT/ Pleuritic Chest Pain     Hospital Area Discharged From: [facility]     Discharge Disposition:  Home    Course of Hospital Stay:     You were admitted to the hospital because of left leg pain and swelling. You were noted to have recurrent blood clots and underwent mechanical thrombolysis and bypass surgery. You were started on Lovenox for anticoagulation with plan to transition to oral blood thinners in 4 weeks. You are also started a new medication called Plavix. You are stable to be discharged home with follow-up with primary care and vascular surgery.    Medications: New, Changed, or Stopped:   Medications at Time of Discharge  - documented as of this encounter  Medication Sig Dispensed Refills Start Date End Date   clopidogreL 75 mg Oral tablet   Indications: Leg DVT (deep venous thromboembolism), acute, left (CMS HCC Code) Take 1 tablet by mouth daily. 30 tablet  3 01/12/2020    docusate sodium 100 MG Oral capsule   Indications: Leg DVT (deep venous thromboembolism), acute, left (CMS HCC Code) Take 1 capsule by mouth 2 (two) times daily. 30 capsule  3 01/11/2020    enoxaparin 100 mg/mL SubQ Syrg   Indications: Leg DVT (deep venous thromboembolism), acute, left (CMS HCC Code) Inject 1 mL into the skin 2 (two) times daily for 30 days. 60 mL  0 01/10/2020 02/09/2020   lisinopriL 5 MG Oral tablet   Indications: Essential hypertension Take 1 tablet by mouth daily. 30 tablet  2 01/11/2020    ondansetron 4 MG Oral disintegrating tablet   Indications: Nausea Take 1 tablet by mouth every 6 (six) hours as needed. 10 tablet  0 12/22/2019    oxyCODONE 5 MG Oral immediate release tablet   Indications: Leg DVT (deep venous thromboembolism), acute, left (CMS HCC Code) Take 1 tablet by mouth every 6 (six) hours as needed for Pain for up  to 30 doses. Max Daily Amount: 20 mg. 28 tablet  0 01/11/19        Future Appointments:     PCP appointment scheduled: no- not yet      Care Manager Interventions or Triage:     Contact 3: Care Manager Interventions or Triage:       Transportation for next appointment secured, by whom:self     Discharge medications reviewed against outpatient MEDICAL RECORD [id number]    Reviewed medications with: Patient     Patient has all new medications and is taking them as prescribed: yes    Patient preferred phone number: [phone]    Caregiver information, name and address: [place] Cerise       Home care agency and services provided: patient declined    Home care contacted patient yet: N/A    New Equipment needs: Other: has supplies     Aeronautical engineer name: unknown    Anticoagulation therapy  yes- on enoxaparin SQ injections through 02/09/2020    Who is managing anticoagulation? surgeon    Future Labs needed: no    Future Imaging needed:  no    Patient report / concerns about present health -reports he had to call his surgeon's office yesterday for increased pain. Went to ED for ultra sound to rule out fluid or hematoma/bleed in abdomin.   Was given Morphine  for increased pain, which reports does the job but makes him sleepy. Only takes at bed time. Agrees to call office on Monday morning to schedule follow up appointment with PCP. Declines questions or concerns at this time. Everything was addressed yesterday in the ED.     Patient Care Management assessment and plan of care/next steps: Contact 4 and Contact 5 follow up     Time spent on phone call with patient/caregiver? 10 min    Time spent in chart review ahead/after call? 20 min

## 2020-01-13 ENCOUNTER — Encounter: Payer: Self-pay | Admitting: Family Medicine

## 2020-01-13 LAB — UNMAPPED LAB RESULTS
ABO RH Blood Type (HT): A POS — NL
Antibody Screen (HT): NEGATIVE — NL
Basophil # (HT): 0.1 10 3/uL — NL (ref 0.0–0.2)
Basophil % (HT): 1 % — NL (ref 0–3)
Eosinophil # (HT): 0.3 10 3/uL — NL (ref 0.0–0.6)
Eosinophil % (HT): 5 % — NL (ref 0–5)
Hematocrit (HT): 33 % — ABNORMAL LOW (ref 40–52)
Hemoglobin (HGB) (HT): 10.6 g/dL — ABNORMAL LOW (ref 13.0–18.0)
Lymphocyte # (HT): 2.3 10 3/uL — NL (ref 1.0–4.8)
Lymphocyte % (HT): 31 % — NL (ref 15–45)
MCHC (HT): 32.6 g/dL — NL (ref 32.0–37.5)
MCV (HT): 91 fL — NL (ref 80–100)
Mean Corpuscular Hemoglobin (MCH) (HT): 29.7 pg — NL (ref 26.0–34.0)
Monocyte # (HT): 0.6 10 3/uL — NL (ref 0.1–1.0)
Monocyte % (HT): 8 % — NL (ref 0–15)
Neutrophil # (HT): 4 10 3/uL — NL (ref 1.8–8.0)
Platelets (HT): 356 10 3/uL — NL (ref 150–450)
RBC (HT): 3.57 10 6/uL — ABNORMAL LOW (ref 4.40–6.20)
RDW (HT): 12.9 % — NL (ref 0.0–15.2)
Seg Neut % (HT): 54 % — NL (ref 45–75)
WBC (HT): 7.4 10 3/uL — NL (ref 4.0–11.0)

## 2020-01-13 NOTE — Progress Notes (Signed)
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 Vascular and hematology were consulted. Wound VAC removed by vascular surgery today.  Follow-up CT chest without malignancy  Continue with Lovenox 100 mg twice daily with plan to do 4 weeks and then transition to DOAC versus warfarin.  Compression to lower extremity  Follow-up with vascular surgery and hematology as an outpatient  Follow-up cardiolipin antibody and beta-2 glycoprotein antibody  Housestaff instructed regarding teaching for home Lovenox    #Postoperative ileus. Surgical team was consulted and patient had NG tube placed. NG tube removed yesterday and tolerating clear liquid diet . Patient having regular bowel movements  Advance diet to regular food  Continue with senna and MiraLAX    #Normocytic anemia with no active bleeding noted.  Continue monitor H&H while on Lovenox and Plavix.    #SIRS. No evidence of infection, likely stress related. Patient was on cefazolin for 48 hours, discontinued  Continue monitor off antibiotics  Blood cultures negative to date    #Lines. Will remove CVC today after obtaining peripheral IV access.    #Disposition. PT/OT eval prior to discharge    Manage Orders  Venous thromboembolism prophylaxis: lovenox  Nutrition: Diet clear liquid  Resuscitation status: Full Code  Cardiac monitoring  (Telemetry): no  Activity status: ambulate with assistance   LEVEL OF CARE AND DISCHARGE PLANNING:   Present level of care: Inpatient  Expected discharge: 01/13/20  Likely Disposition: home     Electronically signed by:  Cyndy Freeze, MD  01/10/2020 11:51 AM  Visit time if different from filing time:    Collapsed details   CURRENT INPATIENT MEDICATION LIST:   I reviewed (and if needed reordered) active medications - see [facility] record   MEDICAL DATA:   Result Review  I reviewed labs - refer to care connect documentation for data   ADDITIONAL RECORD REVIEW AND DISCUSSIONS:   none  -  Lines and Drains   CVC Line     CVC Triple Lumen 01/04/20 Right Internal jugular 5 days       PIV Line     Short peripheral catheter 12/31/19 Left Antecubital 10 days   Short peripheral catheter 01/04/20 Right Forearm 5 days       Drain     Negative Pressure Wound Therapy Anterior;Left;Proximal;Upper 5 days   Negative Pressure Wound Therapy Anterior;Proximal;Right;Upper 5 days         Airways   Airway     [REMOVED] ETT 7.5 <1 day   [REMOVED] ETT 7.5 <1 day         Action taken on Lines, Drains, Airways (if applicable):     BILLING AND COMPLIANCE:   for complexity-based billing, please refer to the history, exam, and decision making above      Electronically signed by Cyndy Freeze, MD at 01/10/2020 12:02 PM EDT  Reason for Hospitalization  You were admitted to the hospital because of left leg pain and swelling. You were noted to have recurrent blood clots and underwent mechanical thrombolysis and bypass surgery. You were started on Lovenox for anticoagulation with plan to transition to oral blood thinners in 4 weeks. You are also started a new medication called Plavix. You are stable to be discharged home with follow-up with primary care and vascular surgery.    My To Do List:  Please take Lovenox injections with last date on 02/04/2020 and switch to DOAC or warfarin based on vascular team  recommendation.  Please take your medication as instructed  Please follow-up with vascular surgery for removal of staples and wound check  Please follow-up with your PCP within a week after discharge    I should have my blood drawn: None   Please get your blood work done early in the day (preferably by 10 AM) so as to get your lab results to your doctor in a timely manner.    I need to have another test: none     Test results that are still pending: --   Pending tests from hospital stay   Order Current Status   Beta-2 Glycoprotein Ab In process   Cardiolipin Ab, IgG & IgM In process   IR venogram lower extremity left In process   IR venogram lower extremity left In process   Surgical Pathology Specimen In process   Blood Culture Preliminary result   Blood Culture Preliminary result       I should talk to Dr. Antony Contras Aminmohamad to obtain these test results and discuss any questions.    Special Care Situations:   My Anticoagulation Instructions    I am taking Anticoagulation for: DVT  Name of my Anticoagulation: Lovenox  My Anticoagulation dose is: 100mg  subcutaneous twice daily     The doctor who manages my Anticoagulation is:    Your hospital doctor recommends that you take your anticoagulation regularly and to stay in routine contact with the doctor who manages your anticoagulation. Your dose of anticoagulation may change with time; it is very important to have your blood checked as scheduled by your doctor. Anticoagulation medications increase the risk of bleeding. Notify your doctor if you develop any signs of bleeding from the rectum, or bleeding from the skin that does not stop after 5-10 minutes. Do not take or discontinue any medication except on advice of your physician or pharmacist.    Physical Activity:  In general after a hospitalization you should plan to slowly increase your activity level over the course of several days to weeks as your condition continues to improve.  You may feel tired for a week or even longer depending upon the severity of your illness.     Diet  Diet orders: Diet heart healthy  Your doctor recommends keeping your weight at an ideal level to improve your overall health. You may discuss details of your diet with Zyrell Carmean Aminmohamad or with a dietician (call [phone] for more information)    Oxygen Orders: No Supplemental Oxygen Needed    Important Testing While I was in the Hospital:  CT angiogram   Impression:  1. Left superficial femoral artery to right femoral vein bypass is patent. The left femoral venous anastomosis to this bypass appears occluded.  2. 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  3. Once again the left common iliac, external  iliac and internal iliac, common femoral, and partially visualized superficial femoral and deep femoral veins are occluded.  4. Small amount of fluid along the left paracolic gutter possibly postsurgical.   5. Bibasilar atelectasis/consolidation left more than right. Trace left pleural effusion.       My Hospital Doctor: Cyndy Freeze, *    I reviewed and approved the instructions, order and medications in this document.  Electronically signed by: Cyndy Freeze, MD    Medications at Time of Discharge  - documented as of this encounter  Medication Sig Dispensed Refills Start Date End Date   clopidogreL 75 mg Oral tablet   Indications: Leg DVT (deep venous thromboembolism), acute, left (CMS HCC Code) Take 1 tablet by mouth daily. 30 tablet  3 01/12/2020    docusate sodium 100 MG Oral capsule   Indications: Leg DVT (deep venous thromboembolism), acute, left (CMS HCC Code) Take 1 capsule by mouth 2 (two) times daily. 30 capsule  3 01/11/2020    enoxaparin 100 mg/mL SubQ Syrg   Indications: Leg DVT (deep venous thromboembolism), acute, left (CMS HCC Code) Inject 1 mL into the skin 2 (two) times daily for 30 days. 60 mL  0 01/10/2020  02/09/2020   lisinopriL 5 MG Oral tablet   Indications: Essential hypertension Take 1 tablet by mouth daily. 30 tablet  2 01/11/2020    ondansetron 4 MG Oral disintegrating tablet   Indications: Nausea Take 1 tablet by mouth every 6 (six) hours as needed. 10 tablet  0 12/22/2019    oxyCODONE 5 MG Oral immediate release tablet   Indications: Leg DVT (deep venous thromboembolism), acute, left (CMS HCC Code) Take 1 tablet by mouth every 6 (six) hours as needed for Pain for up to 30 doses. Max Daily Amount: 20 mg. 28 tablet  0 01/11/2020      Ordered Prescriptions  - documented in this encounter  Reconcile with Patient's Chart  Prescription Sig Dispensed Refills Start Date End Date   lisinopriL 5 MG Oral tablet   Indications: Essential hypertension Take 1 tablet by mouth daily. 30 tablet  2 01/11/2020    oxyCODONE 5 MG Oral immediate release tablet   Indications: Leg DVT (deep venous thromboembolism), acute, left (CMS HCC Code) Take 1 tablet by mouth every 6 (six) hours as needed for Pain for up to 30 doses. Max Daily Amount: 20 mg. 28 tablet  0 01/11/2020    docusate sodium 100 MG Oral capsule   Indications: Leg DVT (deep venous thromboembolism), acute, left (CMS HCC Code) Take 1 capsule by mouth 2 (two) times daily. 30 capsule  3 01/11/2020    clopidogreL 75 mg Oral tablet   Indications: Leg DVT (deep venous thromboembolism), acute, left (CMS HCC Code) Take 1 tablet by mouth daily. 30 tablet  3 01/12/2020    enoxaparin 100 mg/mL SubQ Syrg   Indications: Leg DVT (deep venous thromboembolism), acute, left (CMS HCC Code) Inject 1 mL into the skin 2 (two) times daily for 30 days. 60 mL

## 2020-01-17 ENCOUNTER — Encounter: Payer: Self-pay | Admitting: Family Medicine

## 2020-01-17 ENCOUNTER — Ambulatory Visit: Payer: Medicaid (Managed Care) | Attending: Family Medicine | Admitting: Family Medicine

## 2020-01-17 VITALS — HR 85 | Temp 97.0°F | Ht 67.0 in | Wt 196.4 lb

## 2020-01-17 DIAGNOSIS — R1032 Left lower quadrant pain: Secondary | ICD-10-CM | POA: Insufficient documentation

## 2020-01-17 DIAGNOSIS — R1031 Right lower quadrant pain: Secondary | ICD-10-CM | POA: Insufficient documentation

## 2020-01-17 DIAGNOSIS — T8149XA Infection following a procedure, other surgical site, initial encounter: Secondary | ICD-10-CM | POA: Insufficient documentation

## 2020-01-17 DIAGNOSIS — I82412 Acute embolism and thrombosis of left femoral vein: Secondary | ICD-10-CM | POA: Insufficient documentation

## 2020-01-17 DIAGNOSIS — Z09 Encounter for follow-up examination after completed treatment for conditions other than malignant neoplasm: Secondary | ICD-10-CM

## 2020-01-17 MED ORDER — OXYCODONE HCL 15 MG PO TABS *I*
15.0000 mg | ORAL_TABLET | Freq: Four times a day (QID) | ORAL | 0 refills | Status: AC | PRN
Start: 2020-01-17 — End: 2020-01-24

## 2020-01-17 NOTE — Telephone Encounter (Signed)
Error

## 2020-01-17 NOTE — Patient Instructions (Signed)
Wound care instructions:    Keep the area dry for 48 hours.  After that you can soak it with water.  If you feel there is some redness surrounding the area you can also use a triple antibiotic or Neosporin around this area.  If you are on a baby aspirin/blood thinner, please hold it for just this evening.  If you do note there is more bleeding, please apply pressure to the area for 5 to 10 minutes and see if it stops.  If it does not, then continue holding your baby aspirin/blood thinner and to call our office.    Dressing the wound  Remove the old dressing:  Put on disposable gloves if youre dressing a wound for someone else or if your wound is infected.  Pull gently toward the wound to loosen the tape.  One layer at a time, gently remove the dressing.  If you have a drain or tube, be careful not to pull on it.  Look at the dressing. Make sure that you are seeing a decreasing amount of blood, and that the blood is turning into a clear, amber fluid.  If your wound has stitches, look for loose ones.  Put the dressing in a plastic bag. Then remove your gloves.  Inspect the wound. Look for signs that it isn't healing normally. A wound that isnt healing properly may be dark in color or have white streaks.  Dress the wound:  Wash your hands again.  Clean and dress the wound as you were shown by your healthcare provider or nurse.  If youre dressing a wound for someone else or if your wound is infected, put on a new pair of disposable gloves.  If you have a drain or tube, be careful not to pull on it.  Discard any used materials or trash in a sealed plastic bag before placing in a trash can.    Follow-up  Make a follow-up appointment, or as directed by your healthcare provider.    When to call your healthcare provider  Call your healthcare provider right away if you have any of the following:    Shaking chills or fever above 100.20F ( 38C)  Bleeding that soaks the dressing  Stitches that are pulling away from the wound  or pulling apart  Pink fluid weeping from the wound  Increased drainage from the wound or drainage that is yellow, yellow-green, or smelly  Increased swelling, pain, or redness in the skin around the wound  A change in the color or size of the wound  Increased fatigue  Loss of appetite    It was a pleasure seeing you today in clinic, [facility] [employer].  Just some miscellaneous things I would like to discuss with you.      Please make sure you arrive at least 5-10 minutes before your appointment and let us know if you are running late as soon as you are aware of it.  Also, I will try my best to be on time, but emergencies, patients coming in late, and [place] going over the allotted time may cause delays for others.  With your help, my aim is to respect your time and for a 20 minute appointment I would like if we could limit to 2-3 problems or 3-4 problems for a 40 minute appointment.  In addition, if medications are prescribed to your pharmacy, please make sure you read the inserts on all medications for other side-effects that we may have not had a  chance to cover.      Results: If you do not hear about your lab or imaging results within two weeks of completion, please contact our office. Do not assume no news is good news. For results ordered by a consultant elsewhere, you must contact the office of the ordering provider for results. If you have further questions about your results or symptoms are not improving, you are always welcomed to make an appointment to discuss them in more detail. If you would like to see your results, this can often be done through MyChart.    Prescriptions: When due, please request prescription refills through your pharmacy, except for controlled substances (such as opioids, sedatives and stimulants) which must be processed here. Please allow for three business days for prescription processing. If you call about a prescription, please also identify which pharmacy (such as local or mail  order) you use for this prescription.   If you have general questions about COVID-19, we recommend the [place] Department of Health at website: [place]it or the novel coronavirus hotline: 646 725 1642.    Please be advised, while I try my best to look at the charts on Friday, I am not in office that day so for refills or answers to questions may be delayed, but they should be answered within 2 business days.  Thank you for your understanding and I look forward to seeing you at your next scheduled visit and please do not hesitate to contact my office with any concerns.      Many Blessings,    Tobie Hellen Emiliano Dyer, MD

## 2020-01-17 NOTE — Progress Notes (Signed)
TCM ENCOUNTER  SUBJECTIVE:  Steve Wu is a 49 y.o. male here in hospital follow-up due to:  Chief Complaint   Patient presents with    Follow-up     Wound check x 2 groin     Hospital Admission Date/Discharge Date: 12/30/2019-01/11/2020    Discharge Diagnosis at the time of discharge:  Leg DVT/ Pleuritic Chest Pain    Hospital Area Discharged From: [facility]    Discharge Disposition:  Home    Course of Hospital Stay:      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 Vascular and hematology were consulted. Wound VAC removed by vascular surgery today.  Follow-up CT chest without malignancy  Continue with Lovenox 100 mg twice daily with plan to do 4 weeks and then transition to DOAC versus warfarin.  Compression to lower extremity  Follow-up with vascular surgery and hematology as an outpatient  Follow-up cardiolipin antibody and beta-2 glycoprotein antibody  Housestaff instructed regarding teaching for home Lovenox    #Postoperative ileus. Surgical team was consulted and patient had NG tube placed. NG tube removed yesterday and tolerating clear liquid diet . Patient having regular bowel movements  Advance diet to regular food  Continue with senna and MiraLAX    #Normocytic anemia with no active bleeding noted.  Continue monitor H&H while on Lovenox and Plavix.    #SIRS. No  evidence of infection, likely stress related. Patient was on cefazolin for 48 hours, discontinued  Continue monitor off antibiotics  Blood cultures negative to date    #Lines. Will remove CVC today after obtaining peripheral IV access.    #Disposition. PT/OT eval prior to discharge    Manage Orders  Venous thromboembolism prophylaxis: lovenox  Nutrition: Diet clear liquid  Resuscitation status: Full Code  Cardiac monitoring (Telemetry): no  Activity status: ambulate with assistance   LEVEL OF CARE AND DISCHARGE PLANNING:   Present level of care: Inpatient  Expected discharge: 01/13/20  Likely Disposition: home     Electronically signed by:  Cyndy Freeze, MD  01/10/2020 11:51 AM  Visit time if different from filing time:    Collapsed details   CURRENT INPATIENT MEDICATION LIST:   I reviewed (and if needed reordered) active medications - see [facility] record   MEDICAL DATA:   Result Review  I reviewed labs - refer to care connect documentation for data   ADDITIONAL RECORD REVIEW AND DISCUSSIONS:   none  -  Lines and Drains   CVC Line     CVC Triple Lumen 01/04/20 Right Internal jugular 5 days       PIV Line     Short peripheral catheter 12/31/19 Left Antecubital 10 days   Short peripheral catheter 01/04/20 Right Forearm 5 days       Drain     Negative Pressure Wound Therapy Anterior;Left;Proximal;Upper 5 days   Negative Pressure  Wound Therapy Anterior;Proximal;Right;Upper 5 days         Airways   Airway     [REMOVED] ETT 7.5 <1 day   [REMOVED] ETT 7.5 <1 day         Action taken on Lines, Drains, Airways (if applicable):     BILLING AND COMPLIANCE:   for complexity-based billing, please refer to the history, exam, and decision making above      Electronically signed by [name], [name], MD at 01/10/2020 12:02 PM EDT       Reason for Hospitalization  You were admitted to the hospital because of left leg pain and swelling. You were noted to have recurrent blood clots and underwent mechanical  thrombolysis and bypass surgery. You were started on Lovenox for anticoagulation with plan to transition to oral blood thinners in 4 weeks. You are also started a new medication called Plavix. You are stable to be discharged home with follow-up with primary care and vascular surgery.    My To Do List:  Please take Lovenox injections with last date on 02/04/2020 and switch to DOAC or warfarin based on vascular team recommendation.  Please take your medication as instructed  Please follow-up with vascular surgery for removal of staples and wound check  Please follow-up with your PCP within a week after discharge    I should have my blood drawn: None   Please get your blood work done early in the day (preferably by 10 AM) so as to get your lab results to your doctor in a timely manner.    I need to have another test: none     Test results that are still pending: --   Pending tests from hospital stay   Order Current Status   Beta-2 Glycoprotein Ab In process  - NEG  Cardiolipin Ab, IgG & IgM In process - not in Care everywhere  IR venogram lower extremity left In process - managed by vascular  IR venogram lower extremity left In process - managed by vascular  Surgical Pathology Specimen In process - managed by vascular  Blood Culture Preliminary result   Blood Culture Preliminary result       I should talk to [name], [name] to obtain these test results and discuss any questions.    Special Care Situations:   My Anticoagulation Instructions    I am taking Anticoagulation for: DVT  Name of my Anticoagulation: Lovenox  My Anticoagulation dose is: 100mg  subcutaneous twice daily     The doctor who manages my Anticoagulation is: [name], [name]    Your hospital doctor recommends that you take your anticoagulation regularly and to stay in routine contact with the doctor who manages your anticoagulation. Your dose of anticoagulation may change with time; it is very important to have your blood checked as  scheduled by your doctor. Anticoagulation medications increase the risk of bleeding. Notify your doctor if you develop any signs of bleeding from the rectum, or bleeding from the skin that does not stop after 5-10 minutes. Do not take or discontinue any medication except on advice of your physician or pharmacist.    Physical Activity:  In general after a hospitalization you should plan to slowly increase your activity level over the course of several days to weeks as your condition continues to improve. You may feel tired for a week or even longer depending upon the severity of your illness.     Diet  Diet orders: Diet heart healthy  Your doctor recommends keeping your weight at an ideal level to improve your overall health. You may discuss details of your diet with Hrithik Boschee Aminmohamad or with a dietician (call [phone] for more information)    Oxygen Orders: No Supplemental Oxygen Needed    Important Testing While I was in the Hospital:  CT angiogram   Impression:  1. Left superficial femoral artery to right femoral vein bypass is patent. The left femoral venous anastomosis to this bypass appears occluded.  2. 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  3. Once again the left common iliac, external iliac and internal iliac, common femoral, and partially visualized superficial femoral and deep femoral veins are occluded.  4. Small amount of fluid along the left paracolic gutter possibly postsurgical.   5. Bibasilar atelectasis/consolidation left more than right. Trace left pleural effusion.       My Hospital Doctor: Cyndy Freeze, *    I reviewed and approved the instructions, order and medications in this document.  Electronically signed by: Cyndy Freeze, MD    Medications at Time of Discharge  - documented as of this encounter  Medication Sig Dispensed Refills Start Date End Date   clopidogreL 75 mg Oral tablet   Indications: Leg DVT  (deep venous thromboembolism), acute, left (CMS HCC Code) Take 1 tablet by mouth daily. 30 tablet  3 01/12/2020    docusate sodium 100 MG Oral capsule   Indications: Leg DVT (deep venous thromboembolism), acute, left (CMS HCC Code) Take 1 capsule by mouth 2 (two) times daily. 30 capsule  3 01/11/2020    enoxaparin 100 mg/mL SubQ Syrg   Indications: Leg DVT (deep venous thromboembolism), acute, left (CMS HCC Code) Inject 1 mL into the skin 2 (two) times daily for 30 days. 60 mL  0 01/10/2020 02/09/2020   lisinopriL 5 MG Oral tablet   Indications: Essential hypertension Take 1 tablet by mouth daily. 30 tablet  2 01/11/2020    ondansetron 4 MG Oral disintegrating tablet   Indications: Nausea Take 1 tablet by mouth every 6 (six) hours as needed. 10 tablet  0 12/22/2019    oxyCODONE 5 MG Oral immediate release tablet   Indications: Leg DVT (deep venous thromboembolism), acute, left (CMS HCC Code) Take 1 tablet by mouth every 6 (six) hours as needed for Pain for up to 30 doses. Max Daily Amount: 20 mg. 28 tablet  0 01/11/2020      Ordered Prescriptions  - documented in this encounter  Reconcile with Patient's Chart  Prescription Sig Dispensed Refills Start Date End Date   lisinopriL 5 MG Oral tablet   Indications: Essential hypertension Take 1 tablet by mouth daily. 30 tablet  2 01/11/2020    oxyCODONE 5 MG Oral immediate release tablet   Indications: Leg DVT (deep venous thromboembolism), acute, left (CMS HCC Code) Take 1 tablet by mouth every 6 (six) hours as needed for Pain for up to 30 doses. Max Daily Amount: 20 mg. 28 tablet  0 01/11/2020    docusate sodium 100 MG Oral capsule   Indications: Leg DVT (deep venous thromboembolism), acute, left (CMS HCC Code) Take 1 capsule by mouth 2 (two) times daily. 30 capsule  3 01/11/2020    clopidogreL 75 mg Oral tablet   Indications: Leg DVT (deep venous thromboembolism), acute, left (CMS HCC Code) Take 1 tablet by mouth daily. 30 tablet  3 01/12/2020     enoxaparin 100 mg/mL SubQ  Syrg   Indications: Leg DVT (deep venous thromboembolism), acute, left (CMS HCC Code) Inject 1 mL into the skin 2 (two) times daily for 30 days. 60 mL             Care Manager Interventions or Triage:     Contact 3: Care Manager Interventions or Triage:       Transportation for next appointment secured, by whom:self     Discharge medications reviewed against outpatient MEDICAL RECORD NUMBERyes    Reviewed medications with: Patient    Patient has all new medications and is taking them as prescribed: yes    Patient preferred phone number: [phone]    Caregiver information, name and address: [place] Cerise     Home care agency and services provided: patient declined    Home care contacted patient yet: N/A    New Equipment needs: Other: has supplies     Aeronautical engineer name: unknown    Anticoagulation therapy  yes- on enoxaparin SQ injections through 02/09/2020    Who is managing anticoagulation? surgeon    Future Labs needed: no    Future Imaging needed: no    Patient report / concerns about present health -reports he had to call his surgeon's office yesterday for increased pain. Went to ED for ultra sound to rule out fluid or hematoma/bleed in abdomin.   Was given Morphine for increased pain, which reports does the job but makes him sleepy. Only takes at bed time. Agrees to call office on Monday morning to schedule follow up appointment with PCP. Declines questions or concerns at this time. Everything was addressed yesterday in the ED.     Patient Care Management assessment and plan of care/next steps: Contact 4 and Contact 5 follow up     HPI: The patient recently was hospitalized as above for thrombosis status post mechanical thrombectomy bilaterally. Please refer to the hospital discharge instruction sheet and physician discharge summary.      Summary of symptoms prior to hospitalization: Leg pain  Summary of hospital treatment: Thrombectomy at [facility]    Since  discharge the patient has not shown improvement in symptoms. he is compliant with medications {with significant side effects.      Current complaints/issues: Was recently seen in the ER for pain control status post thrombectomy bilaterally at [facility].  He was discharged on oxycodone 5 mg every 6 hours as needed for pain.  He was told to increase his oxycodone to up to 15 mg q 4-6 hours.  The morphine was way too strong and too powerful.  He only has 3 of these left.  He is concerned he busted some stitches and discharge.   He was placed on Augmentin since Friday for post-op wound infection for 14 days.   No hx of IVDA, recreational substances.  He was last seen at [facility] and saw Tessie Eke, FNP.    CT   IMPRESSION:   1. There is a simple fluid collection anterior to the left common femoral vessels measuring 4.9 x 5.6 cm and likely represents a postsurgical seroma. No extravasation of contrast to suggest pseudoaneurysm or hematoma.   2. There is a fluid collection in the anterior subcutaneous tissue of the lower pelvis with some air and high density material measuring up to 4.8 cm likely a hematoma, compressing on the bypass graft causing moderate to severe stenosis of the   midportion of the bypass graft.   3. There is a partially  visualized DVT in the left common femoral vein extending into the left superficial femoral vein and possibly the profunda femoris vein.   4. There are multiple intraperitoneal collections measuring soft tissue density and likely represent hemorrhagic products versus complex postsurgical fluid collections. One is located in the left lower paracolic gutter and measures up to 3.2 cmand is   mildly increased from prior. The other collection is new and measures up to 2 cm and is posterior to the rectus muscles.   5. Left femoral artery to the right femoral vein bypass graft with left femoral vein anastomosis is patent.       Allergies   Allergen Reactions     Ibuprofen Other (See Comments)    Tylenol [Acetaminophen] Itching     Current Outpatient Medications on File Prior to Visit   Medication Sig Dispense Refill    enoxaparin (LOVENOX) 100 mg/mL injection Inject 100 mg into the skin 2 times daily      amoxicillin-clavulanate (AUGMENTIN) 875-125 MG per tablet Take 1 tablet by mouth 2 times daily      docusate sodium (DSS) 100 MG capsule Take 100 mg by mouth 2 times daily      lisinopril (PRINIVIL,ZESTRIL) 5 MG tablet Take 5 mg by mouth daily      ondansetron (ZOFRAN-ODT) 4 MG disintegrating tablet Take 4 mg by mouth every 6 hours as needed       No current facility-administered medications on file prior to visit.     Social History     Tobacco Use   Smoking Status Former Smoker   Smokeless Tobacco Former Neurosurgeon     ROS:  Review of Systems    OBJECTIVE:  Pulse 85    Temp 36.1 C (97 F)    Ht 1.702 m (5\' 7" )    Wt 89.1 kg (196 lb 6.4 oz)    SpO2 98%    BMI 30.76 kg/m   Physical Exam  Vitals and nursing note reviewed.   Constitutional:       General: He is not in acute distress.     Appearance: Normal appearance. He is obese. He is not ill-appearing, toxic-appearing or diaphoretic.   HENT:      Head: Normocephalic and atraumatic.   Eyes:      General: No scleral icterus.        Right eye: No discharge.         Left eye: No discharge.      Extraocular Movements: Extraocular movements intact.      Conjunctiva/sclera: Conjunctivae normal.      Pupils: Pupils are equal, round, and reactive to light.   Cardiovascular:      Rate and Rhythm: Normal rate and regular rhythm.      Pulses: Normal pulses.      Heart sounds: Normal heart sounds. No murmur heard.  No friction rub. No gallop.    Pulmonary:      Effort: Pulmonary effort is normal. No respiratory distress.      Breath sounds: Normal breath sounds. No stridor. No wheezing, rhonchi or rales.   Chest:      Chest wall: No tenderness.   Abdominal:      General: Bowel sounds are normal. There is no distension.       Palpations: Abdomen is soft. There is no mass.      Tenderness: There is abdominal tenderness. There is no guarding or rebound.      Hernia: No hernia is present.  Comments: Propria tenderness to palpation over abdominal wound.  Staples intact.  No fluctuance or discharge.   Musculoskeletal:      Cervical back: Normal range of motion and neck supple. No muscular tenderness.      Right lower leg: No edema.      Left lower leg: No edema.   Lymphadenopathy:      Cervical: No cervical adenopathy.   Skin:     General: Skin is warm and dry.      Capillary Refill: Capillary refill takes less than 2 seconds.      Coloration: Skin is not jaundiced or pale.      Findings: Erythema, lesion and rash present. No bruising.      Comments: Right groin, abdomen, left groin incisions as below.  Some purulent material out of patient's left groin anteriorly.  There does appear to be 1 or 2 stitches that have been opened up.   Neurological:      Mental Status: He is alert and oriented to person, place, and time. Mental status is at baseline.      Gait: Gait abnormal (Due to groin pain bilaterally left greater than right.).      Deep Tendon Reflexes: Reflexes normal.   Psychiatric:         Mood and Affect: Mood normal.         Behavior: Behavior normal.         Thought Content: Thought content normal.         Judgment: Judgment normal.                   I, Shaneese Tait Aminmohamed Jamaris Biernat, MD,certify that the patient has authorized the use of photography for the purpose of the provision of health care at the [facility] and affiliates and they understand that it will be included in the legal medical record.    I obtained consent for taking the above picture(s) of the patient.  They agree for me to proceed in doing so.  Patient understands that this will be part of the patient's EMR.  The picture(s) was/were only uploaded into the EMR and not saved to the phone.      Data reviewed:  Discharge summary:  yes. See above  for summarization of finding.  Lab results: yes  X-ray results:  yes Direct visualization of image:  no  Medical diagnostic tests:  yes    ASSESSMENT:  1. Hospital discharge follow-up     2. Acute deep vein thrombosis (DVT) of femoral vein of left lower extremity  Drug screen panel, urine    Drug screen panel, urine   3. Bilateral groin pain  Drug screen panel, urine    Drug screen panel, urine   4. Surgery follow-up  Drug screen panel, urine    Drug screen panel, urine   5. Postoperative wound infection       1.  See below for further details.  I did speak with [place] and Nicholos Johns from the vascular specialty clinic where patient is following up.  I did discuss the results of the CT scan that he had done at the ER especially the moderate to severe stenosis to which they reiterated that according to the vascular physician, they would continue to follow-up.  They were concerned if there is continuous drainage or pain out of proportion.  They did mention they would take care of pain management.  I did let them know my plan that I did  start him on oxycodone 15 mg every 6 hours as needed.  I did address my concerns of this may do well into chronic pain.  We will continue to have their expertise in this regard.  His Istop does appear to be appropriate.    2.  Continue on Eliquis and Lovenox.  He is on this for prevention.  Cardiolipin antibody was not present on care everywhere from my end.  We will follow-up with results with vascular.    3/4.  Follow-up as #1.    5.  Augmentin for 14 days twice daily.  Continue current therapy.    Orders Placed This Encounter   Procedures    Drug screen panel, urine     Return in about 3 months (around 04/17/2020) for Physical/WCC.    Orene Abbasi Emiliano Dyer, MD

## 2020-01-18 ENCOUNTER — Encounter: Payer: Self-pay | Admitting: Family Medicine

## 2020-01-19 ENCOUNTER — Telehealth: Payer: Self-pay | Admitting: Family Medicine

## 2020-01-19 NOTE — Telephone Encounter (Signed)
Documentation from [facility] ED visit from early this morning on 9/22. Patient was seen yesterday with Dr. Rushie Chestnut. He was advised to follow up with PCP.

## 2020-01-20 LAB — DRUG SCREEN PANEL, EMERGENCY
Amphetamine,UR: NEGATIVE
Barbiturate,UR: NEGATIVE
Benzodiazepinen,UR: NEGATIVE
Cocaine/Metab,UR: NEGATIVE
Opiates,UR: POSITIVE
THC Metabolite,UR: NEGATIVE

## 2020-01-21 LAB — UNMAPPED LAB RESULTS
Hematocrit (HT): 31 % — ABNORMAL LOW (ref 40–52)
Hemoglobin (HGB) (HT): 10.4 g/dL — ABNORMAL LOW (ref 13.0–17.5)
MCHC (HT): 33.2 g/dL — NL (ref 32.0–36.0)
MCV (HT): 87.9 fL — NL (ref 81.0–99.0)
Mean Corpuscular Hemoglobin (MCH) (HT): 29.2 pg — NL (ref 26.0–34.0)
Platelets (HT): 379 10 3/uL — NL (ref 140–400)
RBC (HT): 3.56 10 6/uL — ABNORMAL LOW (ref 4.20–5.90)
RDW (HT): 13.4 % — NL (ref 11.5–15.0)
WBC (HT): 6.6 10 3/uL — NL (ref 4.0–10.8)

## 2020-01-21 LAB — CONFIRM OPIATES: Confirm Opiates: POSITIVE

## 2020-01-23 ENCOUNTER — Other Ambulatory Visit: Payer: Self-pay | Admitting: Gastroenterology

## 2020-01-23 DIAGNOSIS — T148XXA Other injury of unspecified body region, initial encounter: Secondary | ICD-10-CM | POA: Insufficient documentation

## 2020-01-23 LAB — UNMAPPED LAB RESULTS
ABO RH Blood Type (HT): A POS — NL
Antibody Screen (HT): NEGATIVE — NL
Hematocrit (HT): 32 % — ABNORMAL LOW (ref 40–52)
Hemoglobin (HGB) (HT): 10.2 g/dL — ABNORMAL LOW (ref 13.0–18.0)
MCHC (HT): 31.5 g/dL — ABNORMAL LOW (ref 32.0–37.5)
MCV (HT): 92 fL — NL (ref 80–100)
Mean Corpuscular Hemoglobin (MCH) (HT): 28.8 pg — NL (ref 26.0–34.0)
Platelets (HT): 338 10 3/uL — NL (ref 150–450)
RBC (HT): 3.54 10 6/uL — ABNORMAL LOW (ref 4.40–6.20)
RDW (HT): 14.5 % — NL (ref 0.0–15.2)
WBC (HT): 6.2 10 3/uL — NL (ref 4.0–11.0)

## 2020-01-24 ENCOUNTER — Other Ambulatory Visit: Payer: Self-pay | Admitting: Family Medicine

## 2020-01-24 NOTE — Telephone Encounter (Signed)
Patient states the vascular doctor states his PCP is to manage the pain.    He is [facility] right now.

## 2020-01-24 NOTE — Telephone Encounter (Signed)
Pt needs to sign agreement

## 2020-01-24 NOTE — Telephone Encounter (Signed)
Patient needs to follow-up with his vascular specialist for pain management for his I had told this to the patient initially when he came to see me.  I gave him a 7-day prescription.  I also spoke with his vascular specialist and they would be taking over his pain control.  Thanks.

## 2020-01-26 ENCOUNTER — Encounter: Payer: Self-pay | Admitting: Family Medicine

## 2020-01-26 ENCOUNTER — Telehealth: Payer: Self-pay | Admitting: Family Medicine

## 2020-01-26 DIAGNOSIS — IMO0001 Reserved for inherently not codable concepts without codable children: Secondary | ICD-10-CM | POA: Insufficient documentation

## 2020-01-26 DIAGNOSIS — R911 Solitary pulmonary nodule: Secondary | ICD-10-CM | POA: Insufficient documentation

## 2020-01-26 NOTE — Progress Notes (Signed)
I read over patient's discharge summary and called his vascular specialist and spoke with Dr. Alanda Slim about patient being discharged on Lovenox with Eliquis and Plavix.  There was no documentation as to why this was increased in his discharge summary.  Dr. Alanda Slim will talk with Dr. Gaye Alken who did his surgery and follow-up that he should not be on Lovenox, but to continue the Eliquis and Plavix.

## 2020-01-26 NOTE — Telephone Encounter (Signed)
Post-Discharge Follow-up-Transitional Care Management BILLING Phone Call      Hospital Admission Date/Discharge Date: 01/22/2020-01/25/2020       Discharge Diagnosis at the time of discharge:  Leg DVT, chronic back pain greater than 3 months       Hospital Area Discharged From: [facility]          Are you feeling as good as since you left the hospital? Patient is feeling ok since being discharged from the hospital. He is eating and drinking well as well as ambulating well. He does seem tired quite often but knows this is because he has been in the hospital and needs to regain his strength. Patient's pain has been manageable on the pain medications. All in all he states he is doing fine. He will be following up with vascular on 01/31/2020.      Do you have questions regarding discharge instructions? no-Patient has no questions or concerns at this time.      Do you have an appointment with your PCP? yes  No future appointments.  Appointment made with Dr. Rushie Chestnut on 02/02/2020.    Do you have transportation to get to that appointment? yes-Patient significant other will be transporting him.      Do you have all of the medications listed on your after visit summary/discharge instructions and are you taking them? yes-Patient has all the medications listed on the discharge and is taking them as advised. The medication list has been updated as well.       Do you have any questions or concerns about your medications? no-Patient has no questions or concerns at this time about his medications.        Elizbeth Squires, RN  01/26/2020

## 2020-01-29 ENCOUNTER — Encounter: Payer: Self-pay | Admitting: General Orthopedics

## 2020-01-29 NOTE — ED Provider Notes (Addendum)
[facility]  Emergency Dept Report        Name:     Steve Wu, Steve Wu  Unit No.:     [id number]  Acct No.:    [id number]  Adm. Date: 01/29/20  Status:       DEP ER  Loc:           ED  DOB:         March 07, 2071     PCP:  OTHER MD  History of Present Illness     General  Chief Complaint Gastrointestinal Problem  Stated Complaint EXTREME STOMACH PAIN/NAUSEOUS  Time Seen by MD [phone]  Source patient, RN notes reviewed, old records  Exam Limitations no limitations     History of Present Illness  Presentation  Patient presents to the emergency department late of generalized abdominal   pain  that he states started this morning.  Is accompanied with severe nausea, but   not vomiting "yet".  Patient advised me that he has not been able to all day.  When I did ask him   about the nursing note that said that he was eating potato chips in the   waiting  room, he states that that's the only thing he's eaten today.     Denies fever, chills.  Denies ENT symptoms.  Denies chest pain or shortness of     breath.  Denies constipation or diarrhea.  States that he is moving his   bowels,  with the use of miralax and senna.  Ran out of Dilaudid 2-3 days ago.     Reports that he is on Augmentin for complications following surgery.  Initial   surgery was at [facility] "for blood clots in my legs" (note patient   has extensive abdominal incisions, suspect that he had clot extending up the   IVC) and was readmitted about a week ago for a hematoma of the right groin.     Review of his records shows a retroperitoneal ultrasound 8/1 for right flank   pain that showed nothing.  CT scan was noncontrast dated 7/28, 7/23, 7/19 all   negative for acute pathology.  I do not have access to records at [facility]   regional concerning his recent surgery.  Pain Level 8  Allergies  Coded Allergies:  Acetaminophen (From TYLENOL) (NAUSEA 01/29/20)  Bee Venom (01/29/20)  NSAIDs (HIVES 01/29/20)  No Known Drug Allergy (From No Known Allergies)  (01/29/20)  No Known Environmental Allergies (From No Known Allergies) (01/29/20)  No Known Food Allergy (From No Known Allergies) (01/29/20)  No Known Latex Allergy (From No Known Allergies) (01/29/20)     Home Medications  Discontinued Scripts  Fluconazole* (Diflucan*) 1 TAB PO ONCE         1 Days #1 TAB        Prov:      11/28/19       DC: 01/29/20 1456 Med Stopped Prior to Adm     Reported Medications  Clopidogrel Bisulfate (Clopidogrel) 1 TAB PO DAILY*         Clopidogrel Bisulfate (Clopidogrel) 1 TAB PO DAILY*  #30   Enoxaparin Sodium 1 INJ SUBCUT BID         Enoxaparin Sodium 1 INJ SUBCUT BID  #60   Lisinopril 1 TAB PO DAILY*         Lisinopril 1 TAB PO DAILY*  #30   Amoxicillin   Pot Clavulanate 875-125 MG (Amoxicillin/Clavulanate P 875-125   MG)  1 TAB  PO BID         Amoxicillin   Pot Clavulanate 875-125 MG (Amoxicillin/Clavulanate P 875-  125 MG) 1 TAB PO BID  #28   Polyethylene Glycol 3350 (Peg 3350) 1 PKT PO DAILY*         Polyethylene Glycol 3350 (Peg 3350) 1 PKT PO DAILY*  #30   Sennosides-Docusate Sodium (Sm Senna-S 8.6-50 MG) 1 TAB PO QPM         Sennosides-Docusate Sodium (Sm Senna-S 8.6-50 MG) 1 TAB PO QPM  #20            Past History     Past Medical History  Medical History  HTN, KIDNEY STONES  Surgical History  L5-S1 FUSION, RENAL STENT FOR KIDNEY STONE, APPENDECTOMY, VEIN SURGERY     Social History  Smoking Cessation Former smoker  Alcohol No  Recreational Drugs No     Review Of Systems  Constitutional:  denies chills, denies fever, denies malaise  Ear:  no symptoms reported  Nose:  no symptoms reported  Throat:  no symptoms reported  Respiratory:  no symptoms reported  Cardiovascular:  no symptoms reported  Gastrointestinal:  abdominal pain, nausea  Genitourinary:  no symptoms reported  Skin:  no symptoms reported  Psychiatric:  no symptoms reported  Neurological:  no symptoms reported  Hematologic/Lymphatic:  other (on Plavix and enoxaparin)     Physical Exam     Physical Exam  General  Appearance lying in bed, on his abdomen.  When asked to sit up and put     his mask on, jumps out of bed without significant distress.  Respirations   easy.   Patient climbs in and out of bed without assistance.  Initial Vital Signs  Vital Signs                                      Result    Date Time                        Pulse Ox            98  10/02 1449                        B/P             125/71  10/02 1449                        O2 Flow Rate  Room air  10/02 1449                        Temp              97.2  10/02 1449                        Pulse               89  10/02 1449                        Resp                20  10/02 1449        Respiratory lungs clear, normal breath sounds  Peripheral Pulses  2+  dorsalis pedis (R), 2+ dorsalis pedis (L)  Gastrointestinal soft, no organomegaly, not point tender.  No guarding, no   rebound  Extremities non-tender, normal range of motion, normal inspection  Skin patient has surgical wounds in the abdomen into bilateral groins.    Healing  well.  Does have some increased ecchymosis around the right groin.     Assessment and Plan  Clinical Impression  Primary Impression: Abdominal pain  Qualifiers:  Abdominal location: generalized Qualified Code: R10.84 -   Generalized abdominal pain     Plan of Care  Plan was to obtain patient's records from outlying facility, consult the   surgeons there to clarify his recent history.  Abdominal pain workup along   with  imaging as indicated once surgery consulted.  Patient was advised of this   plan.  as i was putting orders in, I was advised that he walked out AMA.     Departure     Departure  Time of Disposition 1842  Disposition LEFT AGAINST MEDICAL ADVICE  Final Diagnosis  Primary Impression: Abdominal pain  Qualifiers:  Abdominal location: generalized Qualified Code: R10.84 -   Generalized abdominal pain  Condition Poor  Referrals  OTHER MD (PCP/Family): 3-5 days     Patient Handouts Discharge Information (ED)  Departure  Forms  [place] Office of Mental Health  Sign-up for [facility]!     (Electronically Signed)  Report signed by:  Wynonia Musty, PA  Signed:  01/29/20     2128     Documented/created:  Wynonia Musty PA  Date/Time:  01/29/20 1836  cc: OTHER MD

## 2020-01-30 LAB — UNMAPPED LAB RESULTS
Basophil # (HT): 0.1 10 3/uL — NL (ref 0.0–0.2)
Basophil % (HT): 1 % — NL (ref 0–3)
Eosinophil # (HT): 0.2 10 3/uL — NL (ref 0.1–0.6)
Eosinophil % (HT): 3 % — NL (ref 0–5)
Hematocrit (HT): 40 % — NL (ref 40–52)
Hemoglobin (HGB) (HT): 12.8 g/dL — ABNORMAL LOW (ref 13.0–18.0)
Lymphocyte # (HT): 1.9 10 3/uL — NL (ref 1.0–4.8)
Lymphocyte % (HT): 30 % — NL (ref 15–45)
MCHC (HT): 31.8 g/dL — ABNORMAL LOW (ref 32.0–37.5)
MCV (HT): 91 fL — NL (ref 80–100)
Mean Corpuscular Hemoglobin (MCH) (HT): 29 pg — NL (ref 26.0–34.0)
Monocyte # (HT): 0.5 10 3/uL — NL (ref 0.1–1.0)
Monocyte % (HT): 8 % — NL (ref 0–15)
Neutrophil # (HT): 3.7 10 3/uL — NL (ref 1.8–8.0)
Platelets (HT): 238 10 3/uL — NL (ref 150–450)
RBC (HT): 4.42 10 6/uL — NL (ref 4.40–6.20)
RDW (HT): 14 % — NL (ref 9.0–15.2)
Seg Neut % (HT): 58 % — NL (ref 45–75)
WBC (HT): 6.3 10 3/uL — NL (ref 4.0–11.0)

## 2020-01-31 ENCOUNTER — Telehealth: Payer: Self-pay | Admitting: Family Medicine

## 2020-01-31 NOTE — Telephone Encounter (Signed)
Patient also went to [facility] ER yesterday due to lower abdominal pain and back pain that radiates down both legs. All the imaging performed came back ok. He was advised to keep his appointment with [place] on 10/6 and return to ED if symptoms worsen.

## 2020-01-31 NOTE — Telephone Encounter (Signed)
Duplicate

## 2020-01-31 NOTE — Telephone Encounter (Signed)
Patient notified per Dr. Rushie Chestnut. Patient request for appt on 02/01/20 instead of 10/6. Patient verbalizes understanding of the change of date and time.

## 2020-01-31 NOTE — Telephone Encounter (Addendum)
Patient does not have any pain medication at this time after having the Leg Skin Grafts @ [facility] on 02/23/20 per his Case Manager @ [employer].    He was given upon discharge on 01/25/20 Dilaudid for three days which he finished on 01/28/20.    His Case Manager is asking if he can get additional pain medication before his 02/02/20 TCM appointment.  He is not able to come into the office before then as he does not have a ride.    He also cannot take OTC Aleve/Advil, etc due to being on Eliquis and he has an allergy to that medication.    Please advise the patient if he can get a prescription as above before his appointment.

## 2020-01-31 NOTE — Telephone Encounter (Signed)
Left message to call office

## 2020-01-31 NOTE — Telephone Encounter (Signed)
I did have a lengthy discussion with patient that chronic pain medications such as narcotics are not indicated for continued pain.  My thought would be after his visit on the sixth we can try getting his pain under control with nonnarcotic management and if not then we can get him into see pain management which may help with some options.

## 2020-02-01 ENCOUNTER — Ambulatory Visit: Payer: Medicaid (Managed Care) | Admitting: Family Medicine

## 2020-02-01 ENCOUNTER — Encounter: Payer: Self-pay | Admitting: Family Medicine

## 2020-02-01 VITALS — BP 156/80 | HR 96 | Temp 99.7°F | Wt 189.0 lb

## 2020-02-01 DIAGNOSIS — Z09 Encounter for follow-up examination after completed treatment for conditions other than malignant neoplasm: Secondary | ICD-10-CM

## 2020-02-01 DIAGNOSIS — G8918 Other acute postprocedural pain: Secondary | ICD-10-CM

## 2020-02-01 DIAGNOSIS — I871 Compression of vein: Secondary | ICD-10-CM

## 2020-02-01 DIAGNOSIS — I82412 Acute embolism and thrombosis of left femoral vein: Secondary | ICD-10-CM

## 2020-02-01 DIAGNOSIS — Z7901 Long term (current) use of anticoagulants: Secondary | ICD-10-CM

## 2020-02-01 MED ORDER — GABAPENTIN 300 MG PO CAPSULE *I*
ORAL_CAPSULE | ORAL | 1 refills | Status: DC
Start: 2020-02-01 — End: 2020-03-22

## 2020-02-01 MED ORDER — ONDANSETRON HCL 4 MG PO TABS *I*
4.0000 mg | ORAL_TABLET | Freq: Two times a day (BID) | ORAL | 1 refills | Status: DC | PRN
Start: 2020-02-01 — End: 2020-02-07

## 2020-02-01 MED ORDER — DOXEPIN HCL 10 MG/ML PO CONCENTRATED *I*
3.0000 mg | Freq: Every evening | ORAL | 1 refills | Status: AC | PRN
Start: 2020-02-01 — End: ?

## 2020-02-01 MED ORDER — METHOCARBAMOL 750 MG PO TABS *I*
750.0000 mg | ORAL_TABLET | Freq: Three times a day (TID) | ORAL | 1 refills | Status: DC
Start: 2020-02-01 — End: 2020-03-22

## 2020-02-01 NOTE — Progress Notes (Signed)
TCM ENCOUNTER  SUBJECTIVE:  Steve Wu is a 49 y.o. male here in hospital follow-up due to:  Chief Complaint   Patient presents with    Transition Care Managment(TCM)     left leg DVT       *Hospital Admission Date/Discharge Date: 01/22/2020-01/25/2020      Discharge Diagnosis at the time of discharge:  Leg DVT, chronic back pain greater than 3 months      Hospital Area Discharged From: [facility]         Are you feeling as good as since you left the hospital? Patient is feeling ok since being discharged from the hospital. He is eating and drinking well as well as ambulating well. He does seem tired quite often but knows this is because he has been in the hospital and needs to regain his strength. Patient's pain has been manageable on the pain medications. All in all he states he is doing fine. He will be following up with vascular on 01/31/2020.      Do you have questions regarding discharge instructions? no-Patient has no questions or concerns at this time.      Do you have an appointment with your PCP? yes  No future appointments.  Appointment made with Dr. Rushie Chestnut on 02/02/2020.    Do you have transportation to get to that appointment? yes-Patient significant other will be transporting him.      Do you have all of the medications listed on your after visit summary/discharge instructions and are you taking them? yes-Patient has all the medications listed on the discharge and is taking them as advised. The medication list has been updated as well.       Do you have any questions or concerns about your medications? no-Patient has no questions or concerns at this time about his medications.      ED Course 01/30/2020  Differential diagnosis: Aortic dissection, anastomotic leak, internal bleeding,  General review: Laboratory data were reviewed and interpreted and the information was used to make clinical decisions. Radiograph interpretations were reviewed and the information was used  to make clinical decisions.  ED course details: We will give patient 1 round of pain medications hydrate him while checking appropriate diagnostic studies due to his extensive vascular surgery being recently performed although he has presented previously to other emergency departments and has had negative CT angiograms I feel compelled to get a CT angiogram on this patient. And then I will reassess  CBC shows improving hemoglobin from previous studies patient's troponin is negative serum chemistries have no actionable abnormalities patient's CT angiogram actually shows decrease in size in the fluid collections seen on previous CT scan stable appearance of the femoral-femoral venous bypass graft. Patient was advised of his negative  CT scan and appropriate laboratory studies he was advised to maintain and keep his appointment in 2 days time with his vascular surgeon.  Discharge: The patient is ambulatory, alert, and understands directions. I had a detailed discussion with the patient and/or guardian regarding the historical points, exam findings, and any diagnostic results supporting the discharge diagnosis. Based on the patient's history, exam, and diagnositic evaluation, there is no indication for further emergent intervention or inpatient treatment. Verbal and written discharge instructions were provided. Patient was encouraged to return for any worsening symptoms, persisting symptoms, or any other concerns. Patient was provided the opportunity to ask questions.    CT Angio   1. Comparison CT exam January 23, 2020. Stable appearance of femoral  femoral   venous bypass graft with a left-sided AV fistula. Multiple regional fluid   collections as described above with interval decrease in sizes. No obvious   pseudoaneurysm or new fluid collections. No evidence of graft   thrombosis/stricture or significant stenosis/occlusion of the native arterial   branches. Please refer to discussion above regarding previous CT  findings of   left profundal femoral thrombosis.   2. Colonic diverticulosis.     CT abd    Aorta: Precontrast images demonstrates no acute intramural aortic hematoma.   Celiac trunk and mesenteric arteries: No occlusion or significant stenosis.    Renal arteries: No occlusion or significant stenosis.    Right iliac arteries: No occlusion or significant stenosis.    Right femoral/popliteal arteries: No occlusion or significant stenosis.    Right infrapopliteal arteries: No occlusion or significant stenosis.    Left iliac arteries: No occlusion or significant stenosis.    Left femoral/popliteal arteries: No occlusion or significant stenosis.    Left infrapopliteal arteries: No occlusion or significant stenosis.    Bypass grafts: There appears to be a venous femoral femoral bypass graft and   left-sided AV fistula arising from the left common femoral artery as described   on previous report. The venous graft is patent with no evidence of stricture or   intraluminal thrombosis. There is early contrast filling within the right   common femoral vein and right external/common iliac vein. No contrast filling   is seen within the left common femoral and iliac venous segments. There is no   obvious regional pseudoaneurysm. The previous exam demonstrated a small   thrombus in the left profundal femoral vein. This finding cannot be assessed on   the current exam due to lack of regional luminal contrast filling. If this is a   clinical concern, directed duplex ultrasound correlation may be considered.   There is no obvious thrombosis in the right iliac vein segments or IVC.        Liver: No mass.   Gallbladder and bile ducts: Unremarkable. No calcified stones. No ductal   dilation.    Pancreas: Unremarkable. No mass. No ductal dilation.    Spleen: Normal. No splenomegaly.    Adrenals: Normal. No mass.    Kidneys and ureters: Normal. No mass.    Stomach and bowel: Colonic diverticulosis.   Appendix: No evidence of  appendicitis.    Bladder: Unremarkable. No mass.    Reproductive: Unremarkable as visualized.    Intraperitoneal space: Unremarkable. No free air. No significant fluid   collection.    Retroperitoneal space: There is a trace amount of low-density fluid in the left   inferior retroperitoneal space including a somewhat discrete density measuring   2.5 by 1.9 by 2.4 cm which is posterior to the descending/sigmoid colon   junction. This collection appears slightly decreased. There is no regional   inflammatory response to suggest acute diverticulitis or abscess.   Lymph nodes: No lymphadenopathy.   Bones/joints: No acute findings. L5-S1 posterior surgical fusion in stable   alignment.   Soft tissues: There is mild subcutaneous soft tissue haziness/edema in the   lower anterior abdominal wall which may be related to nonspecific edema or   recent surgical procedure and clinical correlation is needed. There is a   discrete deep subcutaneous midline low-density fluid collection in the   infraumbilical/supracervical region, measuring 4.4 by 2.1 cm suggesting a   postop fluid collection/hematoma, versus 5.4 x 2.4 cm previously. Infection   should  also be excluded clinically however there is no obvious enhancing rim.   Another discrete low-density subcutaneous fluid collection is noted in the   anterior left groin, measuring 7.3 x 3.8 cm cross-section and 8.1 cm   craniocaudad versus 8.4 x 5 by 9.3 cm previously, which is abutting the left   common femoral vasculature and portion of the femoral femoral venous graft   between the left and right common femoral vein. Several surgical clips are   noted in the anterior peritoneum below the umbilicus with a small amount of   low-density fluid collection adjacent to it, measuring 1 x 0.8 by 1.4 cm,   contrast-enhanced axial series image 208. This has slightly decreased since   prior exam.         Has the discharge medication list been reconciled to the patient's prior med list?   yes  Any questions or concerns about the patient's medications? no  [facility] 2 business days of [facility]    HPI: The patient recently was hospitalized as above for DVT post op pain. Please refer to the hospital discharge instruction sheet and physician discharge summary.      Summary of symptoms prior to hospitalization: Pain  Summary of hospital treatment: dilaudid for 3 days    Since discharge the patient has shown little improvement in symptoms. he is compliant with medications {without significant side effects.      Current complaints/issues: on lovenox BID, but not on eliquis. He is also on Plavix for antiplatelet therapy.   [facility]   He states he is getting some discomfort from pain in his lower back that affects his sleep.  Sharp.  8-9/10 at worst.   Constant.   He hasn't eaten in 4 days due to nausea.   Dilaudid helped a bit, but oxycodone worked better.  Morphine was too strong.   Had used gabapentin in the past with some help.  Used soma in past for muscle pain.      Allergies   Allergen Reactions    Ibuprofen Other (See Comments)    Tylenol [Acetaminophen] Itching     Current Outpatient Medications on File Prior to Visit   Medication Sig Dispense Refill    clopidogrel (PLAVIX) 75 MG tablet Take 75 mg by mouth daily      enoxaparin (LOVENOX) 100 mg/mL injection Inject 100 mg into the skin 2 times daily      docusate sodium (DSS) 100 MG capsule Take 100 mg by mouth 2 times daily      lisinopril (PRINIVIL,ZESTRIL) 5 MG tablet Take 5 mg by mouth daily      apixaban (ELIQUIS) 5 MG tablet 5 mg 2 times daily (Patient not taking: Reported on 02/01/2020)      HYDROmorphone (DILAUDID) 2 MG tablet Take 2 mg by mouth every 6 hours as needed (Patient not taking: Reported on 02/01/2020)      polyethylene glycol (GLYCOLAX) 17 GM/SCOOP powder Take 17 g by mouth daily as needed      senna-docusate (PERICOLACE) 8.6-50 MG per tablet Take 2  tablets by mouth 2 times daily as needed      oxyCODONE (ROXICODONE) 5 MG immediate release tablet Take 5 mg by mouth every 4-6 hours as needed for Pain (Patient not taking: Reported on 02/01/2020)      ondansetron (ZOFRAN-ODT) 4 MG disintegrating tablet Take 4 mg by mouth every 6 hours as needed (  Patient not taking: Reported on 02/01/2020)       No current facility-administered medications on file prior to visit.     Social History     Tobacco Use   Smoking Status Former Smoker   Smokeless Tobacco Former Neurosurgeon     ROS:  Review of Systems    OBJECTIVE:  BP 156/80    Pulse 96    Temp 37.6 C (99.7 F)    Wt 85.7 kg (189 lb)    SpO2 97%    BMI 29.60 kg/m   Physical Exam  Vitals and nursing note reviewed.   Constitutional:       General: He is in acute distress (Patient appears in pain.).      Appearance: Normal appearance. He is obese. He is not ill-appearing, toxic-appearing or diaphoretic.      Comments: Elevated blood pressure.  Patient in pain.   HENT:      Head: Normocephalic and atraumatic.      Nose: Nose normal. No congestion or rhinorrhea.      Mouth/Throat:      Mouth: Mucous membranes are moist.   Eyes:      General: No scleral icterus.        Right eye: No discharge.         Left eye: No discharge.      Extraocular Movements: Extraocular movements intact.      Conjunctiva/sclera: Conjunctivae normal.      Pupils: Pupils are equal, round, and reactive to light.   Cardiovascular:      Rate and Rhythm: Normal rate and regular rhythm.      Pulses: Normal pulses.      Heart sounds: Normal heart sounds. No murmur heard.  No friction rub. No gallop.    Pulmonary:      Effort: Pulmonary effort is normal. No respiratory distress.      Breath sounds: Normal breath sounds. No stridor. No wheezing, rhonchi or rales.   Chest:      Chest wall: No tenderness.   Musculoskeletal:         General: Swelling, tenderness (Bilateral inguinal regions.  Tender to palpation.  No discharge or bleeding noted.  There is some  loosening of patient's stitches on the left side.    There is some tenderness in all of the surgical scars but left side more than others.) and deformity present.      Cervical back: Normal range of motion and neck supple. No muscular tenderness.      Right lower leg: No edema.      Left lower leg: No edema.   Lymphadenopathy:      Cervical: No cervical adenopathy.   Skin:     General: Skin is warm and dry.      Capillary Refill: Capillary refill takes less than 2 seconds.      Coloration: Skin is not jaundiced or pale.      Findings: Erythema (Left groin scar without discharge, exudate, bleeding.) present. No bruising, lesion or rash.      Comments: Central abdominal scar stable without discharge or redness.   Neurological:      Mental Status: He is alert and oriented to person, place, and time. Mental status is at baseline.      Deep Tendon Reflexes: Reflexes normal.   Psychiatric:         Mood and Affect: Mood normal.         Behavior: Behavior normal.  Thought Content: Thought content normal.         Judgment: Judgment normal.         Data reviewed:  Discharge summary:  yes. See above for summarization of finding.  Lab results: yes  X-ray results:  yes Direct visualization of image:  no  Medical diagnostic tests:  yes    ASSESSMENT:  1. Hospital discharge follow-up     2. Acute DVTof femoral vein of left LE - s/p angioplasty 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 Chronic anticoagulation     5. Postoperative pain         1-5.  Patient still in pain but seems like it is slightly improving.  His last CT scan done in the ER is reassuring.  I will treat patient's pain from nerve perspective and muscle perspective.  I will start the patient on gabapentin and Robaxin.  We will also treat patient's sleep with doxepin low-dose.  Risks and benefits discussed.  Patient advised not to take his Eliquis  then if he was told that by his vascular specialist.  Continue on Plavix with Lovenox.  He is to give himself 0.9 mL twice daily.  Counseled patient on how to do this as he was been giving himself more than this.  I do think his nausea is related to his pain.  As needed Zofran given.  Would be a temporary measure.  Red flag signs discussed.    No orders of the defined types were placed in this encounter.    Return in about 4 weeks (around 02/29/2020) for Postop pain.    Stpehanie Montroy Emiliano Dyer, MD

## 2020-02-01 NOTE — Patient Instructions (Signed)
It was a pleasure seeing you today in clinic, Steve Wu.  Just some miscellaneous things I would like to discuss with you.      Please make sure you arrive at least 5-10 minutes before your appointment and let us know if you are running late as soon as you are aware of it.  Also, I will try my best to be on time, but emergencies, patients coming in late, and us going over the allotted time may cause delays for others.  With your help, my aim is to respect your time and for a 20 minute appointment I would like if we could limit to 2-3 problems or 3-4 problems for a 40 minute appointment.  In addition, if medications are prescribed to your pharmacy, please make sure you read the inserts on all medications for other side-effects that we may have not had a chance to cover.      Results: If you do not hear about your lab or imaging results within two weeks of completion, please contact our office. Do not assume no news is good news. For results ordered by a consultant elsewhere, you must contact the office of the ordering provider for results. If you have further questions about your results or symptoms are not improving, you are always welcomed to make an appointment to discuss them in more detail. If you would like to see your results, this can often be done through MyChart.    Prescriptions: When due, please request prescription refills through your pharmacy, except for controlled substances (such as opioids, sedatives and stimulants) which must be processed here. Please allow for three business days for prescription processing. If you call about a prescription, please also identify which pharmacy (such as local or mail order) you use for this prescription.   If you have general questions about COVID-19, we recommend the [place] Department of Health at website: www.health.[place].gov/diseases/communicable/coronavirus/ or the novel coronavirus hotline: 888-364-3065.    Please be advised, while I try my best  to look at the charts on Friday, I am not in office that day so for refills or answers to questions may be delayed, but they should be answered within 2 business days.  Thank you for your understanding and I look forward to seeing you at your next scheduled visit and please do not hesitate to contact my office with any concerns.      Many Blessings,    Valentine Kuechle Aminmohamed Luisa Louk, MD

## 2020-02-02 ENCOUNTER — Encounter: Payer: Self-pay | Admitting: Family Medicine

## 2020-02-07 ENCOUNTER — Ambulatory Visit: Payer: Medicaid (Managed Care) | Attending: Family Medicine | Admitting: Family Medicine

## 2020-02-07 VITALS — BP 146/88 | HR 106 | Temp 99.2°F | Ht 67.0 in | Wt 189.0 lb

## 2020-02-07 DIAGNOSIS — R58 Hemorrhage, not elsewhere classified: Secondary | ICD-10-CM

## 2020-02-07 DIAGNOSIS — I82412 Acute embolism and thrombosis of left femoral vein: Secondary | ICD-10-CM | POA: Insufficient documentation

## 2020-02-07 DIAGNOSIS — I10 Essential (primary) hypertension: Secondary | ICD-10-CM | POA: Insufficient documentation

## 2020-02-07 LAB — CBC AND DIFFERENTIAL
Baso # K/uL: 0.1 10*3/uL (ref 0.0–0.1)
Basophil %: 0.7 %
Eos # K/uL: 0.2 10*3/uL (ref 0.0–0.5)
Eosinophil %: 2.1 %
Hematocrit: 41 % (ref 40–51)
Hemoglobin: 13.3 g/dL — ABNORMAL LOW (ref 13.7–17.5)
IMM Granulocytes #: 0.1 10*3/uL — ABNORMAL HIGH (ref 0.0–0.0)
IMM Granulocytes: 0.7 %
Lymph # K/uL: 2 10*3/uL (ref 1.3–3.6)
Lymphocyte %: 25.7 %
MCH: 29 pg (ref 26–32)
MCHC: 32 g/dL (ref 32–37)
MCV: 90 fL (ref 79–92)
Mono # K/uL: 0.6 10*3/uL (ref 0.3–0.8)
Monocyte %: 8.4 %
Neut # K/uL: 4.8 10*3/uL (ref 1.8–5.4)
Nucl RBC # K/uL: 0 10*3/uL (ref 0.0–0.0)
Nucl RBC %: 0 /100 WBC (ref 0.0–0.2)
Platelets: 297 10*3/uL (ref 150–330)
RBC: 4.6 MIL/uL (ref 4.6–6.1)
RDW: 14.5 % — ABNORMAL HIGH (ref 11.6–14.4)
Seg Neut %: 62.4 %
WBC: 7.6 10*3/uL (ref 4.2–9.1)

## 2020-02-07 MED ORDER — OXYCODONE HCL 5 MG PO TABS *I*
5.0000 mg | ORAL_TABLET | Freq: Four times a day (QID) | ORAL | 0 refills | Status: AC | PRN
Start: 2020-02-07 — End: 2020-02-14

## 2020-02-07 MED ORDER — CLOPIDOGREL BISULFATE 75 MG PO TABS *I*
75.0000 mg | ORAL_TABLET | Freq: Every day | ORAL | 3 refills | Status: DC
Start: 2020-02-07 — End: 2020-03-31

## 2020-02-07 NOTE — Progress Notes (Signed)
Right AC draw went well, SNW

## 2020-02-07 NOTE — Patient Instructions (Signed)
It was a pleasure seeing you today in clinic, Steve Wu.  Just some miscellaneous things I would like to discuss with you.      Please make sure you arrive at least 5-10 minutes before your appointment and let us know if you are running late as soon as you are aware of it.  Also, I will try my best to be on time, but emergencies, patients coming in late, and [place] going over the allotted time may cause delays for others.  With your help, my aim is to respect your time and for a 20 minute appointment I would like if we could limit to 2-3 problems or 3-4 problems for a 40 minute appointment.  In addition, if medications are prescribed to your pharmacy, please make sure you read the inserts on all medications for other side-effects that we may have not had a chance to cover.      Results: If you do not hear about your lab or imaging results within two weeks of completion, please contact our office. Do not assume no news is good news. For results ordered by a consultant elsewhere, you must contact the office of the ordering provider for results. If you have further questions about your results or symptoms are not improving, you are always welcomed to make an appointment to discuss them in more detail. If you would like to see your results, this can often be done through MyChart.    Prescriptions: When due, please request prescription refills through your pharmacy, except for controlled substances (such as opioids, sedatives and stimulants) which must be processed here. Please allow for three business days for prescription processing. If you call about a prescription, please also identify which pharmacy (such as local or mail order) you use for this prescription.   If you have general questions about COVID-19, we recommend the [place] Department of Health at website: [place].it or the novel coronavirus hotline: 681-517-9664.    Please be advised, while I try my best  to look at the charts on Friday, I am not in office that day so for refills or answers to questions may be delayed, but they should be answered within 2 business days.  Thank you for your understanding and I look forward to seeing you at your next scheduled visit and please do not hesitate to contact my office with any concerns.      Many Blessings,    Sala Tague Emiliano Dyer, MD

## 2020-02-07 NOTE — Progress Notes (Signed)
PATIENT PROGRESS NOTE     Steve Wu is a 49 y.o. male is here today for   Chief Complaint   Patient presents with    Follow-up     right side wound bleeding     HISTORY     HPI:   1.  Patient did coming in riding his bike.  He notes this morning his right side was bleeding pretty good.  He has been seeing vascular surgery at [facility] due to his significant DVT status post thrombectomy.  He was placed on lovenox (not eliquis) and Plavix 75 mg.  He is not on baby aspirin.  For his chronic pain of this we did start gabapentin, Robaxin to help.  I did also start doxepin to help with sleep.  His medication did help for his legs, but not the abdominal area.   He does toss and turn for sleep.  He finds the sleep medication does help, but does continue tossing and turning due to pain.   He states the pain is improving.       2.  He is self employed doing Optometrist.   He has not been working since surgery.  He is wondering if he needs to go on disability for this purpose.  He is giving lovenox in his thigh instead of the stomach due to pain.     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 - 12/2019 R91.1    Hematoma T14.8XXA       Social History     Socioeconomic History     Marital status: Single     Spouse name: Not on file    Number of children: Not on file    Years of education: Not on file    Highest education level: Not on file   Occupational History    Not on file   Tobacco Use    Smoking status: Former Smoker    Smokeless tobacco: Former Neurosurgeon   Substance and Sexual Activity    Alcohol use: Not on file    Drug use: Not on file    Sexual activity: Not on file   Social History Narrative    Not on file       Allergies   Allergen Reactions    Ibuprofen Other (See Comments)    Tylenol [Acetaminophen] Itching       Current Outpatient Medications   Medication    clopidogrel (PLAVIX) 75 MG tablet    gabapentin (NEURONTIN) 300 MG capsule    methocarbamol (ROBAXIN) 750 MG tablet    doxepin (SINEQUAN) 10 mg/mL concentrated solution  enoxaparin (LOVENOX) 100 mg/mL injection    docusate sodium (DSS) 100 MG capsule    lisinopril (PRINIVIL,ZESTRIL) 5 MG tablet    oxyCODONE (ROXICODONE) 5 MG immediate release tablet    polyethylene glycol (GLYCOLAX) 17 GM/SCOOP powder    senna-docusate (PERICOLACE) 8.6-50 MG per tablet    ondansetron (ZOFRAN-ODT) 4 MG disintegrating tablet     No current facility-administered medications for this visit.         PHYSICAL EXAM     Vitals:    02/07/20 1036   BP: 146/88   Pulse: 106   Temp: 37.3 C (99.2 F)   SpO2: 96%   Weight: 85.7 kg (189 lb)   Height: 1.702 m (5\' 7" )       Wt Readings from Last 3 Encounters:   02/07/20 85.7 kg (189 lb)   02/01/20 85.7 kg (189 lb)   01/17/20 89.1 kg (196 lb 6.4 oz)       Hearing/Vision: No exam data present    Physical Exam  Vitals and nursing note reviewed.   Constitutional:       General: He is not in acute distress.     Appearance: Normal appearance. He is well-developed. He is not ill-appearing, toxic-appearing or diaphoretic.      Comments: Elevated blood pressure.  Overweight.   HENT:      Head: Normocephalic and atraumatic.   Eyes:      General: No scleral icterus.        Right eye: No discharge.          Left eye: No discharge.      Extraocular Movements: Extraocular movements intact.      Conjunctiva/sclera: Conjunctivae normal.      Pupils: Pupils are equal, round, and reactive to light.   Cardiovascular:      Rate and Rhythm: Normal rate and regular rhythm.      Pulses: Normal pulses.      Heart sounds: Normal heart sounds. No murmur heard.  No friction rub. No gallop.       Comments: 90 heart rate on my exam.  Pulmonary:      Effort: Pulmonary effort is normal. No respiratory distress.      Breath sounds: Normal breath sounds. No stridor. No wheezing, rhonchi or rales.   Musculoskeletal:         General: Swelling, tenderness, deformity and signs of injury present.      Comments: Right groin with pad covered with blood.  It is still bleeding upon palpation.  No pus or discharge.  Left groin intact with some tenderness to palpation but no bleeding or discharge at the time.   Skin:     General: Skin is warm.   Neurological:      Mental Status: He is alert.   Psychiatric:         Mood and Affect: Mood normal.         Behavior: Behavior normal.         Thought Content: Thought content normal.         Judgment: Judgment normal.         Health Maintenance Topics with due status: Overdue       Topic Date Due    HIV Screening USPSTF/[place] Never done    Hepatitis C Screening Never done    Colon Cancer Screening USPSTF Never done    IMM-INFLUENZA 12/29/2019     Health Maintenance Topics with due status: Completed  Topic Last Completion Date    COVID-19 Vaccine 09/15/2019         ASSESSMENT AND PLAN       ICD-10-CM ICD-9-CM   1. Acute DVTof femoral vein of left LE - s/p angioplasty [facility] 12/20/2019 - continued DVT 12/2019 [facility] s/p mech thrombectomy 01/05/2020 [facility] Lt iliac V, fem V, poplit V  I82.412 453.41   2. Uncontrolled hypertension  I10 401.9   3. Bleeding  R58 459.0     1/3.  Pressure applied.  Abdominal pad placed.  I have discussed wound care with the patient.  Continue to monitor.  I have reduced his Lovenox to  50 mg twice daily instead of 100 mg twice daily due to his current bleeding.  We will check a CBC today.  I will also let his vascular specialist know.  Avoid using his motorcycle.  Avoid injection of Lovenox into patient's thigh but to use it in his abdominal area a little higher up may be more helpful.  He does need close follow-up with his vascular specialist and I am referring him back for this postop care.    2.  Uncontrolled.  Likely due to pain still.  We will continue to monitor at next visit.  Continue lisinopril.  Will retry pain control.  Should this not work for him, we do need pain management involvement.  Did discuss narcotics are not part of chronic pain management.  We will do a 7-day trial of his oxycodone since Tylenol and ibuprofen allergies are present. I stop checked. Left message for Earna Coder, RN about plan of decreasing his Lovenox at patient's Vascular Speicalist office at [facility].    Total face-to-face time (minutes):        2     3     4     5   Established   [] <19   [] 20-29 [] 30-39 [x] 40-54  New     [] <29    [] 30-44  [] 45-59  [] 60-74    If more than 55 minutes for ESTABLISHED patient OR 75 minutes for NEW patient, then add [id number] for each FULL 15 minutes past 54 mins/74 mins.      In this time, I performed wound care, counseled patient, called patient's vascular specialist.        Orders Placed This Encounter   Procedures    CBC and differential    AMB REFERRAL TO VASCULAR SURGERY      Return in about 4 weeks (around 03/06/2020), or if symptoms worsen or fail to improve, for Physical/WCC.  02/07/20  Anahi Belmar , MD    Dragon voice recognition was used to prepare this typewritten note.  Although each note is personally scanned for syntactic or grammatical errors, unintended but conspicuous translational errors can occur.

## 2020-02-08 ENCOUNTER — Telehealth: Payer: Self-pay | Admitting: Family Medicine

## 2020-02-08 NOTE — Telephone Encounter (Signed)
Cannot get a hold of patient. His mailbox is not set up. Will try again later.

## 2020-02-08 NOTE — Result Encounter Note (Signed)
Your blood counts trended down from last check.  Please make sure you are eating iron rich foods as we discussed.  Please follow-up closely with your vascular specialist as soon as possible.  I did call them and let them know of the situation and the decrease in your Lovenox.  They are planning to schedule an appointment with you.

## 2020-02-08 NOTE — Telephone Encounter (Signed)
-----   Message from Alphonsus Sias, MD sent at 02/08/2020  8:23 AM EDT -----  Your blood counts trended down from last check.  Please make sure you are eating iron rich foods as we discussed.  Please follow-up closely with your vascular specialist as soon as possible.  I did call them and let them know of the situation and the decrease in your Lovenox.  They are planning to schedule an appointment with you.

## 2020-02-09 ENCOUNTER — Encounter: Payer: Self-pay | Admitting: Family Medicine

## 2020-02-09 ENCOUNTER — Ambulatory Visit: Payer: Medicaid (Managed Care) | Admitting: Family Medicine

## 2020-02-09 VITALS — BP 148/88 | HR 98 | Temp 97.3°F | Wt 200.4 lb

## 2020-02-09 DIAGNOSIS — T148XXA Other injury of unspecified body region, initial encounter: Secondary | ICD-10-CM

## 2020-02-09 DIAGNOSIS — I82412 Acute embolism and thrombosis of left femoral vein: Secondary | ICD-10-CM

## 2020-02-09 DIAGNOSIS — R52 Pain, unspecified: Secondary | ICD-10-CM

## 2020-02-09 DIAGNOSIS — S31109A Unspecified open wound of abdominal wall, unspecified quadrant without penetration into peritoneal cavity, initial encounter: Secondary | ICD-10-CM

## 2020-02-09 DIAGNOSIS — I671 Cerebral aneurysm, nonruptured: Secondary | ICD-10-CM

## 2020-02-09 NOTE — Telephone Encounter (Signed)
Patient notified and he stated he has a call out to his vascular specialist as well.

## 2020-02-09 NOTE — Patient Instructions (Signed)
It was a pleasure seeing you today in clinic, Steve Wu.  Just some miscellaneous things I would like to discuss with you.      Please make sure you arrive at least 5-10 minutes before your appointment and let us know if you are running late as soon as you are aware of it.  Also, I will try my best to be on time, but emergencies, patients coming in late, and us going over the allotted time may cause delays for others.  With your help, my aim is to respect your time and for a 20 minute appointment I would like if we could limit to 2-3 problems or 3-4 problems for a 40 minute appointment.  In addition, if medications are prescribed to your pharmacy, please make sure you read the inserts on all medications for other side-effects that we may have not had a chance to cover.      Results: If you do not hear about your lab or imaging results within two weeks of completion, please contact our office. Do not assume no news is good news. For results ordered by a consultant elsewhere, you must contact the office of the ordering provider for results. If you have further questions about your results or symptoms are not improving, you are always welcomed to make an appointment to discuss them in more detail. If you would like to see your results, this can often be done through MyChart.    Prescriptions: When due, please request prescription refills through your pharmacy, except for controlled substances (such as opioids, sedatives and stimulants) which must be processed here. Please allow for three business days for prescription processing. If you call about a prescription, please also identify which pharmacy (such as local or mail order) you use for this prescription.   If you have general questions about COVID-19, we recommend the [place] Department of Health at website: www.health.[place].gov/diseases/communicable/coronavirus/ or the novel coronavirus hotline: 888-364-3065.    Please be advised, while I try my best  to look at the charts on Friday, I am not in office that day so for refills or answers to questions may be delayed, but they should be answered within 2 business days.  Thank you for your understanding and I look forward to seeing you at your next scheduled visit and please do not hesitate to contact my office with any concerns.      Many Blessings,    Shandel Busic Aminmohamed Ona Roehrs, MD

## 2020-02-09 NOTE — Telephone Encounter (Signed)
Patient called [facility] Monday as well as today and has not yet heard back  He made an appointment today with Dr. Rushie Chestnut because he has much pain and is bleeding and has drainage.  There is a note in his chart from:  Kennith Maes Osborn Coho, RPA   [address]   [address]   [place], [place] [address]   [phone] (Work)     I called her office and spoke with Agustin Cree to see if I could get him an appointment tomorrow.   Appointment made for tomorrow with Butch Penny NP at [facility], [address], [address] ([address] around back of building),  At 9:20 am.

## 2020-02-09 NOTE — Progress Notes (Signed)
PATIENT PROGRESS NOTE     Steve Wu is a 49 y.o. male is here today for   Chief Complaint   Patient presents with    Follow-up     very painful, dark pink fluid L leg incision, soaked through clothes last night and this morning      HISTORY     HPI:   1.  He is experiencing very painful and dark pink fluid discharge from his leg incision on the left side this time.  This started yesterday.  It soaked through his clothes and this morning.  He has been doing wound care to the right side.  Last time we did wound care on the right side only which had sanguinous discharge only.  He does have an appointment tomorrow with vascular at 9:20 AM at [facility].  He has been given Toradol a couple times in the past for his pain from 15 to 30 mg at a time.  Right side has stopped, but left side started last night.  He had this in the morning at 430 AM.  Pain woke up him.  9/10.  Changed his pad 8 times.  He has not had a ride to [facility] for having stitches removed.  He did double up on his oxycodone (ie 10 mg) helped for a couple of hours.  Sharp pain.  He had a rough experience when he went to [facility] as per patient.  I did see their note which they did indicate possible malingering?    Appointment made for tomorrow with Butch Penny NP at [facility], [address] ([address] around back of building),  At 9:20 am.      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1    Hematoma T14.8XXA       Social History     Socioeconomic History    Marital status: Single     Spouse name: Not on file    Number of children: Not on file    Years of education: Not on file    Highest education level: Not on file   Occupational History    Not on file   Tobacco Use    Smoking status: Former Smoker    Smokeless tobacco: Former Neurosurgeon   Substance and Sexual Activity    Alcohol use: Not on file    Drug use: Not on file    Sexual activity: Not on file   Social History Narrative    Not on file         Allergies   Allergen Reactions    Ibuprofen  Other (See Comments)    Tylenol [Acetaminophen] Itching       Current Outpatient Medications   Medication    clopidogrel (PLAVIX) 75 MG tablet    oxyCODONE (ROXICODONE) 5 MG immediate release tablet    gabapentin (NEURONTIN) 300 MG capsule    methocarbamol (ROBAXIN) 750 MG tablet    doxepin (SINEQUAN) 10 mg/mL concentrated solution    enoxaparin (LOVENOX) 100 mg/mL injection    lisinopril (PRINIVIL,ZESTRIL) 5 MG tablet    ondansetron (ZOFRAN-ODT) 4 MG disintegrating tablet    polyethylene glycol (GLYCOLAX) 17 GM/SCOOP powder    senna-docusate (PERICOLACE) 8.6-50 MG per tablet    docusate sodium (DSS) 100 MG capsule     No current facility-administered medications for this visit.         PHYSICAL EXAM     Vitals:    02/09/20 1026   BP: 148/88   Pulse: 98   Temp: 36.3 C (97.3 F)   SpO2: 96%   Weight: 90.9 kg (200 lb 6.4 oz)       Wt Readings from Last 3 Encounters:   02/09/20 90.9 kg (200 lb 6.4 oz)   02/07/20 85.7 kg (189 lb)   02/01/20 85.7 kg (189 lb)       Hearing/Vision: No exam data present    Physical Exam  Vitals and nursing note reviewed.   Constitutional:       General: He is in acute distress.      Appearance: Normal appearance. He is well-developed. He is  not ill-appearing, toxic-appearing or diaphoretic.   HENT:      Head: Normocephalic and atraumatic.   Eyes:      General: No scleral icterus.        Right eye: No discharge.         Left eye: No discharge.      Extraocular Movements: Extraocular movements intact.      Conjunctiva/sclera: Conjunctivae normal.      Pupils: Pupils are equal, round, and reactive to light.   Cardiovascular:      Rate and Rhythm: Normal rate and regular rhythm.      Pulses: Normal pulses.      Heart sounds: Normal heart sounds. No murmur heard.  No friction rub. No gallop.    Pulmonary:      Effort: Pulmonary effort is normal. No respiratory distress.      Breath sounds: Normal breath sounds. No stridor. No wheezing, rhonchi or rales.   Musculoskeletal:         General: Swelling, tenderness, deformity and signs of injury present.      Comments: Right groin tender to palpation along where serosanguineous discharge is present.  There is no purulence surrounding this.  There is exquisite tenderness to palpation along the inferior margin of the wound.  His sutures are still intact.   Skin:     General: Skin is warm.   Neurological:      Mental Status: He is alert.   Psychiatric:         Mood and Affect: Mood normal.         Behavior: Behavior normal.         Thought Content: Thought content normal.         Judgment: Judgment normal.         Health Maintenance Topics with due status: Overdue       Topic Date Due    HIV Screening USPSTF/[place] Never done    Hepatitis C Screening Never  done    Colon Cancer Screening USPSTF Never done    IMM-INFLUENZA 12/29/2019     Health Maintenance Topics with due status: Completed       Topic Last Completion Date    COVID-19 Vaccine 09/15/2019         ASSESSMENT AND PLAN       ICD-10-CM ICD-9-CM   1. Right groin wound, initial encounter  S31.109A 879.4   2. Uncontrolled pain  R52 780.96   3. Acute DVTof femoral vein of left LE - s/p angioplasty [facility] 12/20/2019 - continued DVT 12/2019 [facility] s/p mech thrombectomy  01/05/2020 [facility] Lt iliac V, fem V, poplit V  I82.412 453.41   4. 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 Hematoma  T14.8XXA 924.9     1-5.  Did discuss and offer patient a Toradol injection to help with pain.  He stated in the past when he had this it did not touch his pain at all.  Discussed patient to seek care in the ER for pain control if it is not improving.  He is hesitant to do so as he did not have a positive experience in the past.  Overall I do think his wound needs to be addressed sooner, and thus I will do an urgent referral to wound care at [facility] in [place] since they would have his records.  He will follow up with vascular tomorrow at 9:20 AM.  Should the patient require more pain medication, a referral to pain management would be helpful.  I do not think continued narcotics will be helpful in the long-term.  Continue muscle relaxer and gabapentin.    Orders Placed This Encounter   Procedures    AMB REFERRAL TO WOUND CENTER        Return if symptoms worsen or fail to improve.  02/09/20  Eugina Row Emiliano Dyer, MD    Dragon voice recognition was used to prepare this typewritten note.  Although each note is personally scanned for syntactic or grammatical errors, unintended but conspicuous translational errors can occur.

## 2020-02-10 ENCOUNTER — Encounter: Payer: Self-pay | Admitting: Gastroenterology

## 2020-02-10 LAB — UNMAPPED LAB RESULTS
Basophil # (HT): 0 10 3/uL — NL (ref 0.0–0.2)
Basophil % (HT): 1 % — NL (ref 0–3)
Eosinophil # (HT): 0.1 10 3/uL — NL (ref 0.0–0.6)
Eosinophil % (HT): 2 % — NL (ref 0–5)
Hematocrit (HT): 41 % — NL (ref 40–52)
Hemoglobin (HGB) (HT): 13.2 g/dL — NL (ref 13.0–18.0)
Lymphocyte # (HT): 1.5 10 3/uL — NL (ref 1.0–4.8)
Lymphocyte % (HT): 28 % — NL (ref 15–45)
MCHC (HT): 32.2 g/dL — NL (ref 32.0–37.5)
MCV (HT): 91 fL — NL (ref 80–100)
Mean Corpuscular Hemoglobin (MCH) (HT): 29.3 pg — NL (ref 26.0–34.0)
Monocyte # (HT): 0.4 10 3/uL — NL (ref 0.1–1.0)
Monocyte % (HT): 7 % — NL (ref 0–15)
Neutrophil # (HT): 3.4 10 3/uL — NL (ref 1.8–8.0)
Platelets (HT): 262 10 3/uL — NL (ref 150–450)
RBC (HT): 4.51 10 6/uL — NL (ref 4.40–6.20)
RDW (HT): 13.9 % — NL (ref 0.0–15.2)
Seg Neut % (HT): 62 % — NL (ref 45–75)
WBC (HT): 5.5 10 3/uL — NL (ref 4.0–11.0)

## 2020-02-11 ENCOUNTER — Other Ambulatory Visit: Payer: Self-pay

## 2020-02-11 LAB — UNMAPPED LAB RESULTS
Hematocrit (HT): 36 % — ABNORMAL LOW (ref 40–52)
Hematocrit (HT): 36 % — ABNORMAL LOW (ref 40–52)
Hemoglobin (HGB) (HT): 11.5 g/dL — ABNORMAL LOW (ref 13.0–18.0)
Hemoglobin (HGB) (HT): 11.6 g/dL — ABNORMAL LOW (ref 13.0–18.0)
MCHC (HT): 31.9 g/dL — ABNORMAL LOW (ref 32.0–37.5)
MCHC (HT): 32.2 g/dL — NL (ref 32.0–37.5)
MCV (HT): 90 fL — NL (ref 80–100)
MCV (HT): 92 fL — NL (ref 80–100)
Mean Corpuscular Hemoglobin (MCH) (HT): 28.8 pg — NL (ref 26.0–34.0)
Mean Corpuscular Hemoglobin (MCH) (HT): 29.7 pg — NL (ref 26.0–34.0)
Platelets (HT): 229 10 3/uL — NL (ref 150–450)
Platelets (HT): 242 10 3/uL — NL (ref 150–450)
RBC (HT): 3.9 10 6/uL — ABNORMAL LOW (ref 4.40–6.20)
RBC (HT): 3.99 10 6/uL — ABNORMAL LOW (ref 4.40–6.20)
RDW (HT): 13.8 % — NL (ref 0.0–15.2)
RDW (HT): 14.1 % — NL (ref 0.0–15.2)
WBC (HT): 6.4 10 3/uL — NL (ref 4.0–11.0)
WBC (HT): 6.7 10 3/uL — NL (ref 4.0–11.0)

## 2020-02-12 LAB — UNMAPPED LAB RESULTS
Hematocrit (HT): 36 % — ABNORMAL LOW (ref 40–52)
Hemoglobin (HGB) (HT): 11.4 g/dL — ABNORMAL LOW (ref 13.0–18.0)
MCHC (HT): 32 g/dL — NL (ref 32.0–37.5)
MCV (HT): 93 fL — NL (ref 80–100)
Mean Corpuscular Hemoglobin (MCH) (HT): 29.6 pg — NL (ref 26.0–34.0)
Platelets (HT): 231 10 3/uL — NL (ref 150–450)
RBC (HT): 3.85 10 6/uL — ABNORMAL LOW (ref 4.40–6.20)
RDW (HT): 14 % — NL (ref 0.0–15.2)
WBC (HT): 6.1 10 3/uL — NL (ref 4.0–11.0)

## 2020-02-13 LAB — UNMAPPED LAB RESULTS
Hematocrit (HT): 38 % — ABNORMAL LOW (ref 40–52)
Hemoglobin (HGB) (HT): 11.8 g/dL — ABNORMAL LOW (ref 13.0–18.0)
MCHC (HT): 31 g/dL — ABNORMAL LOW (ref 32.0–37.5)
MCV (HT): 93 fL — NL (ref 80–100)
Mean Corpuscular Hemoglobin (MCH) (HT): 28.9 pg — NL (ref 26.0–34.0)
Platelets (HT): 228 10 3/uL — NL (ref 150–450)
RBC (HT): 4.09 10 6/uL — ABNORMAL LOW (ref 4.40–6.20)
RDW (HT): 14 % — NL (ref 0.0–15.2)
WBC (HT): 5.6 10 3/uL — NL (ref 4.0–11.0)

## 2020-02-14 HISTORY — PX: OTHER SURGICAL HISTORY: SHX169

## 2020-02-14 LAB — UNMAPPED LAB RESULTS
Hematocrit (HT): 36 % — ABNORMAL LOW (ref 40–52)
Hemoglobin (HGB) (HT): 11.3 g/dL — ABNORMAL LOW (ref 13.0–18.0)
MCHC (HT): 31 g/dL — ABNORMAL LOW (ref 32.0–37.5)
MCV (HT): 92 fL — NL (ref 80–100)
Mean Corpuscular Hemoglobin (MCH) (HT): 28.7 pg — NL (ref 26.0–34.0)
Platelets (HT): 239 10 3/uL — NL (ref 150–450)
RBC (HT): 3.94 10 6/uL — ABNORMAL LOW (ref 4.40–6.20)
RDW (HT): 13.8 % — NL (ref 0.0–15.2)
WBC (HT): 5.7 10 3/uL — NL (ref 4.0–11.0)

## 2020-02-15 ENCOUNTER — Telehealth: Payer: Self-pay | Admitting: Family Medicine

## 2020-02-15 LAB — UNMAPPED LAB RESULTS
Hematocrit (HT): 39 % — ABNORMAL LOW (ref 40–52)
Hemoglobin (HGB) (HT): 12 g/dL — ABNORMAL LOW (ref 13.0–18.0)
MCHC (HT): 31.2 g/dL — ABNORMAL LOW (ref 32.0–37.5)
MCV (HT): 91 fL — NL (ref 80–100)
Mean Corpuscular Hemoglobin (MCH) (HT): 28.5 pg — NL (ref 26.0–34.0)
Platelets (HT): 261 10 3/uL — NL (ref 150–450)
RBC (HT): 4.21 10 6/uL — ABNORMAL LOW (ref 4.40–6.20)
RDW (HT): 13.6 % — NL (ref 0.0–15.2)
WBC (HT): 10.7 10 3/uL — NL (ref 4.0–11.0)

## 2020-02-15 NOTE — Telephone Encounter (Signed)
Patient called to advise [place] he was admitted to [facility] on 02/10/20 due to continued Wound Leakage in his leg with further surgery required.    He will wait to hear from our office in regards to needing a follow-up here once he is discharged.

## 2020-02-16 ENCOUNTER — Encounter: Payer: Self-pay | Admitting: Family Medicine

## 2020-02-16 LAB — UNMAPPED LAB RESULTS
Hematocrit (HT): 40 % — NL (ref 40–52)
Hemoglobin (HGB) (HT): 12.7 g/dL — ABNORMAL LOW (ref 13.0–18.0)
MCHC (HT): 31.9 g/dL — ABNORMAL LOW (ref 32.0–37.5)
MCV (HT): 92 fL — NL (ref 80–100)
Mean Corpuscular Hemoglobin (MCH) (HT): 29.2 pg — NL (ref 26.0–34.0)
Platelets (HT): 201 10 3/uL — NL (ref 150–450)
RBC (HT): 4.35 10 6/uL — ABNORMAL LOW (ref 4.40–6.20)
RDW (HT): 13.8 % — NL (ref 0.0–15.2)
WBC (HT): 7.1 10 3/uL — NL (ref 4.0–11.0)

## 2020-02-17 ENCOUNTER — Telehealth: Payer: Self-pay | Admitting: Family Medicine

## 2020-02-17 LAB — UNMAPPED LAB RESULTS
Hematocrit (HT): 41 % — NL (ref 40–52)
Hemoglobin (HGB) (HT): 12.4 g/dL — ABNORMAL LOW (ref 13.0–18.0)
MCHC (HT): 30.1 g/dL — ABNORMAL LOW (ref 32.0–37.5)
MCV (HT): 95 fL — NL (ref 80–100)
Mean Corpuscular Hemoglobin (MCH) (HT): 28.4 pg — NL (ref 26.0–34.0)
Platelets (HT): 254 10 3/uL — NL (ref 150–450)
RBC (HT): 4.36 10 6/uL — ABNORMAL LOW (ref 4.40–6.20)
RDW (HT): 13.8 % — NL (ref 0.0–15.2)
WBC (HT): 6.6 10 3/uL — NL (ref 4.0–11.0)

## 2020-02-17 NOTE — Telephone Encounter (Signed)
I spoke with patient's hospitalist Dr. Wyvonnia Lora with mentioned patient's postop care with a wound VAC placed and plastic surgery had seen him and placed 3 JP drains he was trained on how to drain these and not the tubes.  He does have a follow-up with plastic surgery on either the 25th or 26th of this month.  He was placed on 1 mg/kg of Lovenox twice daily due to his previous clots.  He is no longer on Eliquis.  Hematology will decide when to convert him to Eliquis.  He was given a 30-day supply of Lovenox by the care manager on discharge but this does need a prior authorization which should be covered within a day or so.  Patient's pain control was also an issue where he required postop a PCA Dilaudid pump.  Pain management inpatient suggested 15 mg of oxycodone for a total of 50 tablets without any refills.  He should not be on any of this long-term as his postop pain should improve.  Patient did get Xanax 0.25 mg while in hospital as needed for sleep but it seemed he was asking for this quite a bit for future days.  No Xanax tablets were given on discharge.  His gabapentin was also increased to twice daily dosing.

## 2020-02-17 NOTE — Telephone Encounter (Signed)
Patient was discharged from [facility] on 02/17/20.    Diagnosis: wound infection    Appointment with Dr. Rushie Chestnut on 02/22/20    Records requested Yes     Dr. Wyvonnia Lora [phone] would like a call back to discuss.

## 2020-02-18 ENCOUNTER — Telehealth: Payer: Self-pay

## 2020-02-18 NOTE — Telephone Encounter (Signed)
Out of System Transitions Care Management Documentation:        Hospital Admission Date/Discharge Date: 02/10/2020-02/17/2020     Discharge Diagnosis at the time of discharge: Wound infection and uncontrolled pain.       Hospital Area Discharged From: [facility]     Discharge Disposition:  Home    Course of Hospital Stay:     Left groin wound dehiscence and wound infection status post plastic surgery debridement and flap, wound VAC and drains in place, discharged home and follow with plastic surgery outpatient. Pain medications adjusted.    Patient discharged home with wound VAC and 3 JP drains, confirmed with patient that he got teaching from plastic surgery how to milk JP drains.    Seen by PT, cleared for home dc with walker which was prescribed       Medications: New, Changed, or Stopped:   augmentin BID for 10 more days   Oxycodone 15 mg Q 3 hrs PRN for pain control, 50 pills given   Gabapentin dose increased from 300 mg daily to BID  lovenox 80 mg BID until seen by hematology   Medications at Time of Discharge  - documented as of this encounter  Medication Sig Dispensed Refills Start Date End Date   amoxicillin-clavulanate (AUGMENTIN) 875-125 mg Oral per tablet   Indications: SKIN/SOFT TISSUE INFECTION Take 1 tablet by mouth 2 (two) times daily for 10 days. Indications: SKIN/SOFT TISSUE INFECTION 20 tablet  0 02/17/2020 02/27/2020   clopidogreL 75 mg Oral tablet   Indications: Leg DVT (deep venous thromboembolism), acute, left (CMS HCC Code) Take 1 tablet by mouth daily. 30 tablet  3 01/12/2020    doxepin 10 mg/mL Oral solution  Take 3-6 mg by mouth.  0 02/01/2020    enoxaparin 80 mg/0.8 mL SubQ injection   Indications: Acute deep vein thrombosis (DVT) of femoral vein of left lower extremity (CMS HCC Code) Inject 0.8 mLs into the skin every 12 (twelve) hours for 30 days. 48 mL  0 02/17/2020 03/18/2020   gabapentin 300 MG Oral capsule   Indications: Lumbar disc herniation with radiculopathy Take 1 capsule  by mouth 2 (two) times daily for 30 days. 60 capsule  0 02/17/2020 03/18/2020   lisinopriL 5 MG Oral tablet   Indications: Essential hypertension Take 1 tablet by mouth daily. 30 tablet  2 01/11/2020    methocarbamoL 750 MG Oral tablet  Take 750 mg by mouth.  0 02/01/2020    ondansetron 4 MG Oral disintegrating tablet   Indications: Nausea Take 1 tablet by mouth every 6 (six) hours as needed. 10 tablet  0 01/12/2020    oxyCODONE 15 MG Oral immediate release tablet   Indications: Uncontrolled pain Take 1 tablet by mouth every 3 (three) hours as needed. Max Daily Amount: 120 mg. 50 tablet  0 02/17/2020    polyethylene glycol 17 gram Oral packet   Indications: Constipation, unspecified constipation type Take 1 packet by mouth daily as needed for Other (constipation) for up to 30 doses. 30 packet  0 01/25/2020    senna-docusate 8.6-50 mg Oral per tablet   Indications: Constipation, unspecified constipation type Take 2 tablets by mouth 2 (two) times daily as needed for Constipation. 20 tablet  0 01/25/2020    Ordered Prescriptions  - documented in this encounter  Reconcile with Patient's Chart  Prescription Sig Dispensed Refills Start Date End Date   oxyCODONE 15 MG Oral immediate release tablet   Indications: Uncontrolled pain Take 1 tablet by  mouth every 3 (three) hours as needed. Max Daily Amount: 120 mg. 50 tablet  0 02/17/2020    gabapentin 300 MG Oral capsule   Indications: Lumbar disc herniation with radiculopathy Take 1 capsule by mouth 2 (two) times daily for 30 days. 60 capsule  0 02/17/2020 03/18/2020   amoxicillin-clavulanate (AUGMENTIN) 875-125 mg Oral per tablet   Indications: SKIN/SOFT TISSUE INFECTION Take 1 tablet by mouth 2 (two) times daily for 10 days. Indications: SKIN/SOFT TISSUE INFECTION 20 tablet  0 02/17/2020 02/27/2020   enoxaparin 80 mg/0.8 mL SubQ injection   Indications: Acute deep vein thrombosis (DVT) of femoral vein of left lower extremity (CMS HCC Code) Inject  0.8 mLs into the skin every 12 (twelve) hours for 30 days. 48 mL             Future Appointments:   Follow up with PCP next week   Follow up with plastic surgery next week for wound care and drains/wound vac  Follow up with hematology in 1-2 weeks to discuss anticoagulation options until then continue lovenox injections, your dose is decreased from 90 mg twice daily to 80 mg twice daily, please inject the dose as per new prescription     PCP appointment scheduled: yes-02/23/2020 at 3 pm      Care Manager Interventions or Triage:     Contact 3: Care Manager Interventions or Triage:       Transportation for next appointment secured, by whom:self -[facility] will help with transportation    Discharge medications reviewed against outpatient MEDICAL RECORD [id number]    Reviewed medications with: Patient     Patient has all new medications and is taking them as prescribed: yes-on;ly has enough Lovenox to get him through weekend; awaiting insurance prior authorization    Patient preferred phone number: [phone]    Caregiver information, name and address: [facility]- same       Home care agency and services provided: declined    Home care contacted patient yet: N/A    New Equipment needs: Rolling walker and Other: vac machine & wound care supplies     New Equipment vendor name:     Anticoagulation therapy  yes- Lovenox    Who is managing anticoagulation? Plastic surgery    Future Labs needed: yes    Future Imaging needed:  no    Patient report / concerns about present health -reports he is doing fine, but pain is still present. Reports tripping coming down the stairs from upstairs bathroom. Held onto railing, so did not fall, just bumped his leg with the stitches, dressing and vac machine attached. Reports foot and leg remain painful and swollen. States the vac machine & pump are intact and appear to be running appropriately. Denies any questions on medications and/or follow up appointments.       Patient Care Management assessment  and plan of care/next steps: follow up with contact 4 and contact 5. Available as needed.    Time spent on phone call with patient/caregiver? 10 min    Time spent in chart review ahead/after call? 25 min

## 2020-02-22 ENCOUNTER — Encounter: Payer: Self-pay | Admitting: Family Medicine

## 2020-02-23 ENCOUNTER — Encounter: Payer: Self-pay | Admitting: Family Medicine

## 2020-02-23 ENCOUNTER — Encounter: Payer: Self-pay | Admitting: Gastroenterology

## 2020-02-23 LAB — UNMAPPED LAB RESULTS
Basophil # (HT): 0 10 3/uL — NL (ref 0.0–0.2)
Basophil % (HT): 0 % — NL (ref 0–3)
Eosinophil # (HT): 0.1 10 3/uL — NL (ref 0.0–0.6)
Eosinophil % (HT): 1 % — NL (ref 0–5)
Hematocrit (HT): 37 % — ABNORMAL LOW (ref 40–52)
Hemoglobin (HGB) (HT): 11.8 g/dL — ABNORMAL LOW (ref 13.0–18.0)
Lymphocyte # (HT): 1.7 10 3/uL — NL (ref 1.0–4.8)
Lymphocyte % (HT): 30 % — NL (ref 15–45)
MCHC (HT): 32.2 g/dL — NL (ref 32.0–37.5)
MCV (HT): 89 fL — NL (ref 80–100)
Mean Corpuscular Hemoglobin (MCH) (HT): 28.6 pg — NL (ref 26.0–34.0)
Monocyte # (HT): 0.5 10 3/uL — NL (ref 0.1–1.0)
Monocyte % (HT): 8 % — NL (ref 0–15)
Neutrophil # (HT): 3.3 10 3/uL — NL (ref 1.8–8.0)
Platelets (HT): 240 10 3/uL — NL (ref 150–450)
RBC (HT): 4.13 10 6/uL — ABNORMAL LOW (ref 4.40–6.20)
RDW (HT): 13.7 % — NL (ref 0.0–15.2)
Seg Neut % (HT): 60 % — NL (ref 45–75)
WBC (HT): 5.6 10 3/uL — NL (ref 4.0–11.0)

## 2020-02-23 NOTE — Telephone Encounter (Addendum)
FYI - [facility] called to cancel TCM appt.  She is taking him back  [facility]. He is in a lot of pain.    I called patient back, then he said when [facility] gets home, he wants her to take him to [place] or [place].

## 2020-02-24 ENCOUNTER — Telehealth: Payer: Self-pay | Admitting: Family Medicine

## 2020-02-24 NOTE — Telephone Encounter (Signed)
Patient notified and states he will continue to take with what he has and go from there.

## 2020-02-24 NOTE — Telephone Encounter (Signed)
Patient states he needs a stronger Pain Medication for his Left Leg pain after the surgical procedure done on 02/17/20 @ [facility].    He states his surgeon told him we would have to change his pain medications.    He is currently taking Oxycodone 15mg  q4-6 hrs which is not touching the pain.    Please advise-

## 2020-02-24 NOTE — Telephone Encounter (Signed)
We would need to see him for medication adjustments but it looks like he has been on long-term narcotics so they should not be increased.  Perhaps some topical treatments would be helpful or physical therapy.

## 2020-02-27 ENCOUNTER — Encounter: Payer: Self-pay | Admitting: Gastroenterology

## 2020-02-27 ENCOUNTER — Other Ambulatory Visit: Payer: Self-pay

## 2020-02-27 LAB — CBC
Baso # K/uL: 0.04 10*3/uL (ref 0.02–0.05)
Basophil %: 0.5 % (ref 0.0–2.0)
Eos # K/uL: 0.13 10*3/uL — ABNORMAL LOW (ref 0.15–0.70)
Eosinophil %: 1.7 % (ref 1.0–7.0)
Hematocrit: 39.6 % — ABNORMAL LOW (ref 41.0–49.0)
Hemoglobin: 13 g/dL — ABNORMAL LOW (ref 14.0–16.7)
Lymph # K/uL: 1.75 10*3/uL (ref 1.20–4.40)
Lymphocyte %: 23.2 % (ref 20.0–45.0)
MCH: 28.9 pg/cell (ref 28.0–33.0)
MCHC: 32.8 g/dL (ref 31.0–35.0)
MCV: 88 fL (ref 80.0–95.0)
Mean Platelet Volume: 8.6 fL (ref 7.2–10.2)
Mono # K/uL: 0.45 10*3/uL (ref 0.11–0.80)
Monocyte %: 6 % (ref 1.0–10.0)
Neut # K/uL: 5.15 10*3/uL (ref 1.40–7.50)
Platelets: 287 10*3/uL (ref 130–400)
RBC: 4.5 M/uL — ABNORMAL LOW (ref 4.60–6.10)
RDW: 13.3 % (ref 11.0–16.0)
Seg Neut %: 68.2 % (ref 40.0–75.0)
WBC: 7.6 10*3/uL (ref 4.8–10.8)

## 2020-02-27 LAB — UNMAPPED LAB RESULTS
Basophil # (HT): 0.1 10 3/uL — NL (ref 0.0–0.2)
Basophil % (HT): 1 % — NL (ref 0–3)
Eosinophil # (HT): 0.2 10 3/uL — NL (ref 0.0–0.6)
Eosinophil % (HT): 3 % — NL (ref 0–5)
Hematocrit (HT): 39 % — ABNORMAL LOW (ref 40–52)
Hemoglobin (HGB) (HT): 12.9 g/dL — ABNORMAL LOW (ref 13.0–18.0)
Lymphocyte # (HT): 2.3 10 3/uL — NL (ref 1.0–4.8)
Lymphocyte % (HT): 30 % — NL (ref 15–45)
MCHC (HT): 32.9 g/dL — NL (ref 32.0–37.5)
MCV (HT): 87 fL — NL (ref 80–100)
Mean Corpuscular Hemoglobin (MCH) (HT): 28.6 pg — NL (ref 26.0–34.0)
Monocyte # (HT): 0.5 10 3/uL — NL (ref 0.1–1.0)
Monocyte % (HT): 7 % — NL (ref 0–15)
Neutrophil # (HT): 4.4 10 3/uL — NL (ref 1.8–8.0)
Platelets (HT): 282 10 3/uL — NL (ref 150–450)
RBC (HT): 4.51 10 6/uL — NL (ref 4.40–6.20)
RDW (HT): 13.4 % — NL (ref 0.0–15.2)
Seg Neut % (HT): 59 % — NL (ref 45–75)
WBC (HT): 7.5 10 3/uL — NL (ref 4.0–11.0)

## 2020-02-27 LAB — BASIC METABOLIC PANEL
Anion Gap: 11 mmol/L (ref 7–16)
CO2: 19 mmol/L — ABNORMAL LOW (ref 21–31)
Calcium: 9.1 mg/dL (ref 8.4–10.2)
Chloride: 105 mmol/L (ref 101–111)
Creatinine: 0.87 mg/dL (ref 0.61–1.24)
GFR,Black: 114 mL/min (ref 60–130)
GFR,Caucasian: 94 (ref 60–130)
Glucose: 155 mg/dL — ABNORMAL HIGH (ref 70–100)
Lab: 8 mg/dL (ref 6–20)
Osmolality: 271.6 mosm/L — ABNORMAL LOW (ref 275–305)
Potassium: 3.7 mmol/L (ref 3.6–5.0)
Sodium: 135 mmol/L (ref 135–145)
UN/Creat Ratio: 9.2 — ABNORMAL LOW (ref 12.0–20.0)

## 2020-02-27 LAB — CRP: CRP: 0.9 mg/dL (ref 0.6–1.0)

## 2020-02-27 LAB — SEDIMENTATION RATE, AUTOMATED: Sedimentation Rate: 10 mm/HR (ref 0–15)

## 2020-02-28 ENCOUNTER — Telehealth: Payer: Self-pay | Admitting: Family Medicine

## 2020-02-28 ENCOUNTER — Other Ambulatory Visit: Payer: Self-pay | Admitting: Gastroenterology

## 2020-02-28 LAB — UNMAPPED LAB RESULTS
Basophil # (HT): 0.1 10 3/uL — NL (ref 0.0–0.2)
Basophil % (HT): 1 % — NL (ref 0–3)
Eosinophil # (HT): 0.2 10 3/uL — NL (ref 0.0–0.6)
Eosinophil % (HT): 2 % — NL (ref 0–5)
Hematocrit (HT): 41 % — NL (ref 40–52)
Hematocrit (HT): 37 % — ABNORMAL LOW (ref 40–52)
Hemoglobin (HGB) (HT): 13.1 g/dL — NL (ref 13.0–18.0)
Hemoglobin (HGB) (HT): 12 g/dL — ABNORMAL LOW (ref 13.0–18.0)
Lymphocyte # (HT): 1.6 10 3/uL — NL (ref 1.0–4.8)
Lymphocyte % (HT): 14 % — ABNORMAL LOW (ref 15–45)
MCHC (HT): 32.1 g/dL — NL (ref 32.0–37.5)
MCHC (HT): 32.1 g/dL — NL (ref 32.0–37.5)
MCV (HT): 89 fL — NL (ref 80–100)
MCV (HT): 89 fL — NL (ref 80–100)
Mean Corpuscular Hemoglobin (MCH) (HT): 28.4 pg — NL (ref 26.0–34.0)
Mean Corpuscular Hemoglobin (MCH) (HT): 28.5 pg — NL (ref 26.0–34.0)
Monocyte # (HT): 0.6 10 3/uL — NL (ref 0.1–1.0)
Monocyte % (HT): 5 % — NL (ref 0–15)
Neutrophil # (HT): 9.2 10 3/uL — ABNORMAL HIGH (ref 1.8–8.0)
Platelets (HT): 281 10 3/uL — NL (ref 150–450)
Platelets (HT): 270 10 3/uL — NL (ref 150–450)
RBC (HT): 4.61 10 6/uL — NL (ref 4.40–6.20)
RBC (HT): 4.21 10 6/uL — ABNORMAL LOW (ref 4.40–6.20)
RDW (HT): 13.6 % — NL (ref 0.0–15.2)
RDW (HT): 13.3 % — NL (ref 0.0–15.2)
Seg Neut % (HT): 79 % — ABNORMAL HIGH (ref 45–75)
WBC (HT): 7.3 10 3/uL — NL (ref 4.0–11.0)
WBC (HT): 11.7 10 3/uL — ABNORMAL HIGH (ref 4.0–11.0)

## 2020-02-28 NOTE — Telephone Encounter (Signed)
Patient states since returning from ED this morning his leg is very swollen and painful.    He was advised per the Nurses to call his surgeons office or go back to the ED.

## 2020-03-01 ENCOUNTER — Telehealth: Payer: Self-pay | Admitting: Family Medicine

## 2020-03-01 ENCOUNTER — Ambulatory Visit: Payer: Self-pay | Admitting: Family Medicine

## 2020-03-01 DIAGNOSIS — R1030 Lower abdominal pain, unspecified: Secondary | ICD-10-CM

## 2020-03-01 DIAGNOSIS — G8929 Other chronic pain: Secondary | ICD-10-CM

## 2020-03-01 NOTE — Telephone Encounter (Signed)
I can refer the patient to pain management, but I would not be filling anymore narcotics.  Thanks.

## 2020-03-01 NOTE — Telephone Encounter (Signed)
Patient called again asking if this medication from his historical meds could be sent today

## 2020-03-01 NOTE — Telephone Encounter (Signed)
We've explained before this couldn't be resent. Please advise

## 2020-03-01 NOTE — Telephone Encounter (Signed)
Still a lot of pain and swelling in his leg.would like a refill of his oxycodone 15mg  1 every 6 hours [facility] in [place] call him back .

## 2020-03-02 NOTE — Addendum Note (Signed)
Addended by: Antony Contras on: 03/02/2020 11:29 AM     Modules accepted: Orders

## 2020-03-02 NOTE — Telephone Encounter (Signed)
Patient was given the message below.    Please create a referral to Pain Management for him as he is agreeable to this.

## 2020-03-06 NOTE — Telephone Encounter (Signed)
Patient states he needs pain medication for his continued leg pain from our office as the Pain Clinic will not prescribe medication nor will his surgeons office.

## 2020-03-06 NOTE — Telephone Encounter (Signed)
I have done a referral to pain management.  I would like for him to follow-up with them.  They will not be prescribing narcotics but we need to find other mechanisms to help his pain best.

## 2020-03-07 NOTE — Telephone Encounter (Signed)
Referral sent again this morning with Dr. Delena Serve reply.  I called patient and gave him the number to schedule.

## 2020-03-07 NOTE — Telephone Encounter (Signed)
Please advise referral and give him the information for that office.

## 2020-03-09 ENCOUNTER — Encounter: Payer: Self-pay | Admitting: Pain Medicine

## 2020-03-14 ENCOUNTER — Encounter: Payer: Self-pay | Admitting: Pain Medicine

## 2020-03-20 ENCOUNTER — Telehealth: Payer: Self-pay | Admitting: Family Medicine

## 2020-03-20 NOTE — Telephone Encounter (Signed)
Patient's EC on file, [facility], will pick this letter up today.

## 2020-03-20 NOTE — Telephone Encounter (Signed)
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    He states he will pick this letter up to give to his Caseworker.

## 2020-03-20 NOTE — Telephone Encounter (Signed)
Letter created.  Please print for patient.  Thank you.

## 2020-03-21 ENCOUNTER — Encounter: Payer: Self-pay | Admitting: Pain Medicine

## 2020-03-21 ENCOUNTER — Ambulatory Visit: Payer: Medicaid (Managed Care) | Admitting: Pain Medicine

## 2020-03-21 VITALS — BP 123/67 | HR 78 | Temp 97.7°F | Resp 16

## 2020-03-21 DIAGNOSIS — M79652 Pain in left thigh: Secondary | ICD-10-CM

## 2020-03-21 DIAGNOSIS — M792 Neuralgia and neuritis, unspecified: Secondary | ICD-10-CM

## 2020-03-21 NOTE — Progress Notes (Addendum)
Anesthesiology Pain Consult Note  [facility] Treatment Center at [facility]    This is a confidential report written only for the purpose of professional communication.  Clients who wish to receive these findings are requested to contact the Pain Treatment Center at [facility] [phone].       Steve Wu is a 49 y.o. year old male.  DOB: 18-May-1970  Primary Care Physician: Alphonsus Sias, MD  Outpatient pain medication prescriber: Alphonsus Sias, MD  Chief Complaint: chronic groin pain/left leg pain  Consult requested by: Alphonsus Sias, MD  Reason for Consultation: management recommendations, evaluation for an interventional procedure    History of Present Illness:Steve Wu is a 49 y.o. year-old male with hx of lumbar disc herniation, anterior lumbar fusion, diverticulosis, HLD, HTN, Acute DVTof femoral vein of left LE - s/p angioplasty [facility] 12/20/2019 - continued DVT 12/2019 [facility] s/p mech thrombectomy 01/05/2020 [facility] Lt iliac V, fem V, poplit V, May-Thurner syndrome - [place] [facility] 12/2019, kidney stones, chronic anticoagulation.  He presents for initial evaluation of left lower extremity pain. He has a history of recurrent DVT of left lower extremity s/p recent venogram, mechanical thrombolysis and left femoral to right femoral vein bypass graft and creation of left groin AV fistula by vascular surgery in 11/2019.  On 02/10/20 he presented to ED with worsening left lower extremity and left groin pain associated with blood-tinged discharge from the left groin wound/incision area. Patient was seen by plastic surgery and vascular surgery, underwent debridement of left groin, left rectus femoris muscle flap, left quadriceps tendon repair and Prevena VAC application on 02/14/2020, 3 JP drains left in place. Patient had uncontrolled postoperative pain therefore he was placed on Dilaudid PCA pump then palliative care team was called for consult to help pain control, he was weaned down slowly from  Dilaudid PCA pump over 72-hour period, meanwhile started on oxycodone scheduled and increased his gabapentin from 300 mg at nighttime to 300 mg twice daily. On day of discharge his pain was better controlled therefore after discussion with palliative care team he was discharged on increased dose of gabapentin along with oxycodone 15 mg every 3 hours as needed. Since then he has presented several times to ED with the left lower extremity pain. His last Doppler US was done on 02/28/20 without evidence of thrombosis.    His pain is located in the left groin and left lower extremity. The pain is present constantly. He describes the pain as sharp, stabbing. Aggravating factors include walking, standing, stairs. Alleviating factors include sitting.  Patient  reports weakness and swelling in the left leg, but denies numbness/tingling in the lower extremities.     Information in this section was obtained from the direct interview with a patient and review of records.    Pain scores:  Pain rating today: 9/10    Functional Level:   - Independent mobility without assistive devices.  - Independent ADL performance without assistance.    Treatment History     Pharmacological  A) Present Pain Medications & Effectiveness    - gabapentin 300 mg three times daily, not taking as often, finds ineffective  - Robaxin 750 mg 3 times daily    B) Historical Experience with Pain Related Medications & Effectiveness:    Effective  Percocet     Not effective  Tylenol, ibuprofen    Not tried  Gabapentin/Lyrica/Topamax/Carbamazepine/Oxcarbazepine  Meloxicam/ibuprofen/Naproxen/diclofenac/etodolac/nabumetone/diflunisal/Celebrex  Lidocaine/Voltaren/Flector/Salonpas  Flexeril//Skelaxin/Tizanidine/Baclofen/Norflex  Duloxetine/venlafaxine/amitriptyline/nortriptyline  Norco/Percocet//Morphine/Dilaudid/Tramadol/Tapentadol  Methadone/Suboxone/Fentanyl      Past  Medical History  Past Medical History:   Diagnosis Date    Chronic back pain greater than 3  months duration 01/08/2017    Current smoker 01/08/2017    Uncontrolled hypertension 01/08/2017       Currently Active Problems  Patient Active Problem List   Diagnosis Code    Chronic back pain greater than 3 months duration M54.9, G89.29    Uncontrolled hypertension I10    Hemorrhagic cystitis - 10/2019 ER visit N30.91    History of kidney stones Z87.442    Mixed hyperlipidemia E78.2    Lumbar disc herniation with radiculopathy M51.16    Former smoker Z87.891    Acute DVTof femoral vein of left LE - s/p angioplasty [facility] 12/20/2019 - continued DVT 12/2019 [facility] s/p mech thrombectomy 01/05/2020 [facility] Lt iliac V, fem V, poplit V I82.412    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    May-Thurner syndrome - [place] [facility] 12/2019 I87.1    Chronic anticoagulation Z79.01    Pleuritic chest pain R07.81    Diverticulosis CT Abd [facility] 12/2019 K57.90    Lung nodules < 6cm on CT - [facility] - 12/2019 R91.1    Hematoma T14.8XXA       Past Surgical History  Past Surgical History:   Procedure Laterality Date    APPENDECTOMY      BACK SURGERY  2013    [facility] [place]    left groin washout Left 02/14/2020    [facility] - Rectus femoris muscle flap    PTCA Left 12/20/2019    [facility] - Placement of catheter in theleftiliofemoral veins;and venogram of left lower extremity with cavogram, interpretation of films, removal of McNamara thrombolysis infusion catheter.     THROMBECTOMY Left 01/05/2020    [facility] - Dr Armandina Gemma - mechanical thromboectomy - AngioJet Lt iliacV, Fem V, Poplit V       Social History  Social History     Socioeconomic History    Marital status: Single     Spouse name: Not on file    Number of children: Not on file    Years of education: Not on file    Highest education level: Not on file   Occupational History    Not on file   Tobacco Use    Smoking status: Former Smoker    Smokeless tobacco: Former Neurosurgeon   Substance and Sexual Activity    Alcohol use: Not on file    Drug use: Not on file    Sexual activity: Not on  file   Social History Narrative    Not on file         Employment: not employed    Counsellor and Family Circumstances: lives alone      Allergies:   Allergies   Allergen Reactions    Ibuprofen Other (See Comments)    Tylenol [Acetaminophen] Itching       Current Meds:    Current Outpatient Medications   Medication    clopidogrel (PLAVIX) 75 MG tablet    gabapentin (NEURONTIN) 300 MG capsule    methocarbamol (ROBAXIN) 750 MG tablet    doxepin (SINEQUAN) 10 mg/mL concentrated solution    polyethylene glycol (GLYCOLAX) 17 GM/SCOOP powder    senna-docusate (PERICOLACE) 8.6-50 MG per tablet    docusate sodium (DSS) 100 MG capsule    lisinopril (PRINIVIL,ZESTRIL) 5 MG tablet    ondansetron (ZOFRAN-ODT) 4 MG disintegrating tablet     No current facility-administered medications for this visit.  Review Of Systems:  A comprehensive review of 10 systems was performed and reviewed with the patient. Please refer to scanned intake form for details.      Prior Investigations/Imaging:  02/29/2020 Doppler US    Findings:      Common femoral: Normal flow     Proximal Saphenous: Normal flow     Profunda: Normal flow     Femoral: The mid and distal femoral vein is not visualized due to overlying bandaging and patient condition.     Popliteal: normal compressibility and flow     Posterior tibial: Not visualized overlying edema     Peroneal: Not visualized overlying edema     Normal lower extremity venous waveforms present.     Impression: Extremely limited exam given patient inability to tolerate transducer pressure. The common femoral, proximal saphenous and profunda veins show normal color Doppler flow. The mid and distal femoral vein as well as the calf veins are not   visualized due to overlying bandaging and edema.       Lab Results:    Chemistry    Lab Results   Component Value Date/Time    Sodium 135 02/27/2020 1410    Potassium 3.7 02/27/2020 1410    Chloride 105 02/27/2020 1410    CO2 19 (L) 02/27/2020  1410    Anion Gap 11 02/27/2020 1410    UN 8 02/27/2020 1410    GFR,Caucasian 94 02/27/2020 1410    GFR,Black 114 02/27/2020 1410    Glucose 155 (H) 02/27/2020 1410    Calcium 9.1 02/27/2020 1410    Total Protein 7.5 11/29/2019 1749    CRP 0.9 02/27/2020 1410     Hematology:    Lab Results   Component Value Date/Time    WBC 7.6 02/27/2020 1410    RBC 4.50 (L) 02/27/2020 1410    Hemoglobin 13.0 (L) 02/27/2020 1410    Hematocrit 39.6 (L) 02/27/2020 1410    MCV 88.0 02/27/2020 1410    RDW 13.3 02/27/2020 1410    Neut # K/uL 5.15 02/27/2020 1410    Lymph # K/uL 1.75 02/27/2020 1410    Mono # K/uL 0.45 02/27/2020 1410    Eos # K/uL 0.13 (L) 02/27/2020 1410    Baso # K/uL 0.04 02/27/2020 1410    Sedimentation Rate 10 02/27/2020 1410       Physical Exam:  Pain    03/21/20 1521   PainSc:   9   PainLoc: Leg      Vital Signs:   Vitals:    03/21/20 1521   BP: 123/67   Pulse: 78   Resp: 16   Temp: 36.5 C (97.7 F)           Physical Exam  Constitutional:       General: He is awake.      Appearance: Normal appearance. He is well-developed, well-groomed and overweight.   Musculoskeletal:      Right lower leg: Normal. No swelling. No edema.      Left lower leg: Tenderness present. No deformity or lacerations. 1+ Pitting Edema present.        Legs:       Comments: Surgical scar on the anterior aspect of the left proximal lower extremity   Skin:     General: Skin is warm and dry.      Findings: Rash (on the proximal anterior left thigh) present. Rash is macular. Rash is not crusting, purpuric or vesicular.   Neurological:  Mental Status: He is alert and oriented to person, place, and time.      Sensory: Sensation is intact.      Motor: Motor function is intact.   Psychiatric:         Attention and Perception: Attention and perception normal.         Mood and Affect: Mood and affect normal.         Speech: Speech normal.         Behavior: Behavior normal. Behavior is cooperative.         Thought Content: Thought content normal.          Cognition and Memory: Cognition and memory normal.         Judgment: Judgment normal.           Opioid Risk Assessment:   Male Male   Family History of Substance Abuse     Alcohol []  1 []  3   Illegal Drugs []  2 []  3   Rx Drugs []  4 []  4     Personal History of Substance Abuse     Alcohol []  3 []  3   Illegal Drugs []  4 []  4   Rx Drugs []  5 []  5        Age Between 5516-45 []  1 []  1        History of Preadolescent Sexual Abuse []  3 []  0     Pyschologic Disease     ADD, OCD, Bipolar, Schizoprenia []  2 []  2   Depression []  1 []  1     Total = 0    Low risk 0-3   Moderate risk 4-7   High risk >8     iSTOP Registry Review: Reviewed.  Reference number: [id number]    Assessment/Plan:   Steve Wu is a 49 y.o. year-old male with hx of lumbar disc herniation, anterior lumbar fusion, diverticulosis, HLD, HTN, Acute DVTof femoral vein of left LE - [facility] - [facility]/05/2020 [facility] Lt iliac V, fem V, poplit V, May-Thurner syndrome - [place] [facility] 12/2019, kidney stones, chronic anticoagulation.  He presents for initial evaluation of left lower extremity neuropathic post phlebetic  pain.  We have discussed our recommendations with the patient. We recommend:      Medications:  Consider a titration of gabapentin by 300mg  every week to 600 mg three times daily as tolerated. Alternatively, a trial of Lyrica can be considered with close monitoring of patient's edema 75mg  BID which can be titrated as tolerated by 75 mg every week to 150 mg TID  Consider a trial of nortriptyline 25 mg at bedtime, with further titration every 2 weeks up to 75 mg at bedtime as tolerated  Alternatively, amy try duloxetine 30-60 mg daily  Patient was also advised to use compression stockings to decrease edema, improve pain and healing.      Follow up:  As needed  Risks and benefits were explained. Patient has verbalized understanding and agreed to proceed with a proposed treatment plan.    Patient  was encouraged to contact us for any worsening symptoms, persisting symptoms, or any other concerns.  Patient was provided the opportunity to ask questions.  Thank you for allowing us the opportunity to care for this patient.    Patient was discussed, examined, and treatment plan developed with the attending, Dr. Betha LoaAnnie Alvira Hecht, MBBS.    Dictated by: Mendel CorningAlexandr Cherpel, PA  03/21/2020  I saw and evaluated the patient with PA Cherpel. I personally examined the patient and formulated the above plan discussed this with the patient. I have reviewed and edited the above note and I agree with the above plan.Steve Wu is a 49 y.o. year-old male with hx of lumbar disc herniation, anterior lumbar fusion, diverticulosis, HLD, HTN, Acute DVTof femoral vein of left LE - s/p angioplasty [facility] 12/20/2019 - continued DVT 12/2019 [facility] s/p mech thrombectomy 01/05/2020 [facility] Lt iliac V, fem V, poplit V, May-Thurner syndrome - [place] [facility] 12/2019, kidney stones, chronic anticoagulation. He presents for initial evaluation of left lower extremity neuropathic post phlebetic  pain.

## 2020-03-22 ENCOUNTER — Other Ambulatory Visit: Payer: Self-pay | Admitting: Family Medicine

## 2020-03-22 ENCOUNTER — Other Ambulatory Visit: Payer: Self-pay

## 2020-03-22 MED ORDER — ENOXAPARIN SODIUM 80 MG/0.8ML IJ SOSY *I*
80.0000 mg | PREFILLED_SYRINGE | Freq: Two times a day (BID) | INTRAMUSCULAR | 1 refills | Status: AC
Start: 2020-03-22 — End: ?

## 2020-03-22 NOTE — Telephone Encounter (Signed)
Patient was advised to ask his PCP for the Lovenox refill as the specialist is out of the office.

## 2020-03-29 ENCOUNTER — Telehealth: Payer: Self-pay | Admitting: Family Medicine

## 2020-03-29 NOTE — Telephone Encounter (Signed)
Advised patient we could see him first thing in the morning but he declined. He states he will go to the ER if his leg worsens in any way but at the time being it is ok with him resting and elevating it.

## 2020-03-29 NOTE — Telephone Encounter (Addendum)
Left Leg is more swollen than usual for the past 2-3 days with increased pain and warmth to his leg.    He had wanted to see Dr. Rushie Chestnut today as he has a ride to get here but we do not have any appointments. He can not come into the office any other day.    Can this be triaged more if needed as I was not sure what to advise him?

## 2020-03-30 ENCOUNTER — Encounter: Payer: Self-pay | Admitting: Gastroenterology

## 2020-03-31 ENCOUNTER — Other Ambulatory Visit: Payer: Self-pay | Admitting: Family Medicine

## 2020-03-31 DIAGNOSIS — I10 Essential (primary) hypertension: Secondary | ICD-10-CM

## 2020-03-31 MED ORDER — CLOPIDOGREL BISULFATE 75 MG PO TABS *I*
75.0000 mg | ORAL_TABLET | Freq: Every day | ORAL | 3 refills | Status: AC
Start: 2020-03-31 — End: ?

## 2020-03-31 MED ORDER — LISINOPRIL 5 MG PO TABS *I*
5.0000 mg | ORAL_TABLET | Freq: Every day | ORAL | 3 refills | Status: AC
Start: 2020-03-31 — End: ?

## 2020-03-31 NOTE — Telephone Encounter (Signed)
Patient states he is out of both of these medications.

## 2020-03-31 NOTE — Telephone Encounter (Signed)
Clopidogrel sent 02/07/20. He should contact pharmacy for this refill.

## 2020-04-05 ENCOUNTER — Telehealth: Payer: Self-pay | Admitting: Family Medicine

## 2020-04-05 MED ORDER — ONDANSETRON HCL 4 MG PO TABS *I*
4.0000 mg | ORAL_TABLET | Freq: Every day | ORAL | 1 refills | Status: AC | PRN
Start: 2020-04-05 — End: ?

## 2020-04-05 NOTE — Telephone Encounter (Signed)
New script queued to sign.

## 2020-04-05 NOTE — Telephone Encounter (Signed)
We can go ahead and change her to a 48 quantity supply.  Thank you.

## 2020-04-05 NOTE — Telephone Encounter (Signed)
Patient needs an appointment

## 2020-04-05 NOTE — Addendum Note (Signed)
Addended by: Antony Contras on: 04/05/2020 05:31 PM     Modules accepted: Orders

## 2020-04-05 NOTE — Telephone Encounter (Signed)
Pt called regarding a request for a referral for an eye doctor.   Feels he needs glasses wants to go get a full eye exam completed.     Eye doctor preferred: [facility]      Pt will call [facility] tomorrow to get booster shot due to spike in cases

## 2020-04-05 NOTE — Telephone Encounter (Signed)
Received form from pharmacy that PA has been started for patient for ondansetron 4mg  tablet. Our system shows for medication: Quantity Limit: 48 Quantity per 27 Day. Prescribed 60 tablets. Do you want to change amount dispensed.

## 2020-04-05 NOTE — Addendum Note (Signed)
Addended by: Elizbeth Squires on: 04/05/2020 01:17 PM     Modules accepted: Orders

## 2020-04-06 NOTE — Telephone Encounter (Signed)
Patient scheduled with Steve Wu on 04/07/20 to get the referral as he has Medicaid

## 2020-04-07 ENCOUNTER — Ambulatory Visit: Payer: Self-pay | Admitting: Family Medicine

## 2020-04-10 ENCOUNTER — Ambulatory Visit: Payer: Self-pay | Admitting: Primary Care

## 2020-04-27 ENCOUNTER — Encounter: Payer: Self-pay | Admitting: Gastroenterology

## 2020-05-05 ENCOUNTER — Telehealth: Payer: Self-pay | Admitting: Family Medicine

## 2020-05-05 NOTE — Telephone Encounter (Signed)
FYI:  Record Release faxed to [facility] as patient has transferred out of the practice.

## 2020-05-14 ENCOUNTER — Other Ambulatory Visit: Payer: Self-pay | Admitting: Family Medicine

## 2020-05-16 NOTE — Telephone Encounter (Signed)
Transferred out of practice 05/05/20. 30 days queued. Gabapentin adjusted per last script.

## 2020-07-10 ENCOUNTER — Other Ambulatory Visit: Payer: Self-pay | Admitting: Family Medicine

## 2020-07-13 ENCOUNTER — Other Ambulatory Visit: Payer: Self-pay | Admitting: Family Medicine

## 2020-12-25 IMAGING — CR DG LUMBAR SPINE 2-3V
3 series · 3 of 3 positions shown · non-contrast
Comparison: CT 02/04/2019

CLINICAL DATA: Low back pain. Fell from ladder yesterday. Pain
extends to the right leg.

EXAM:
LUMBAR SPINE - 2-3 VIEW

[l-spine ap]
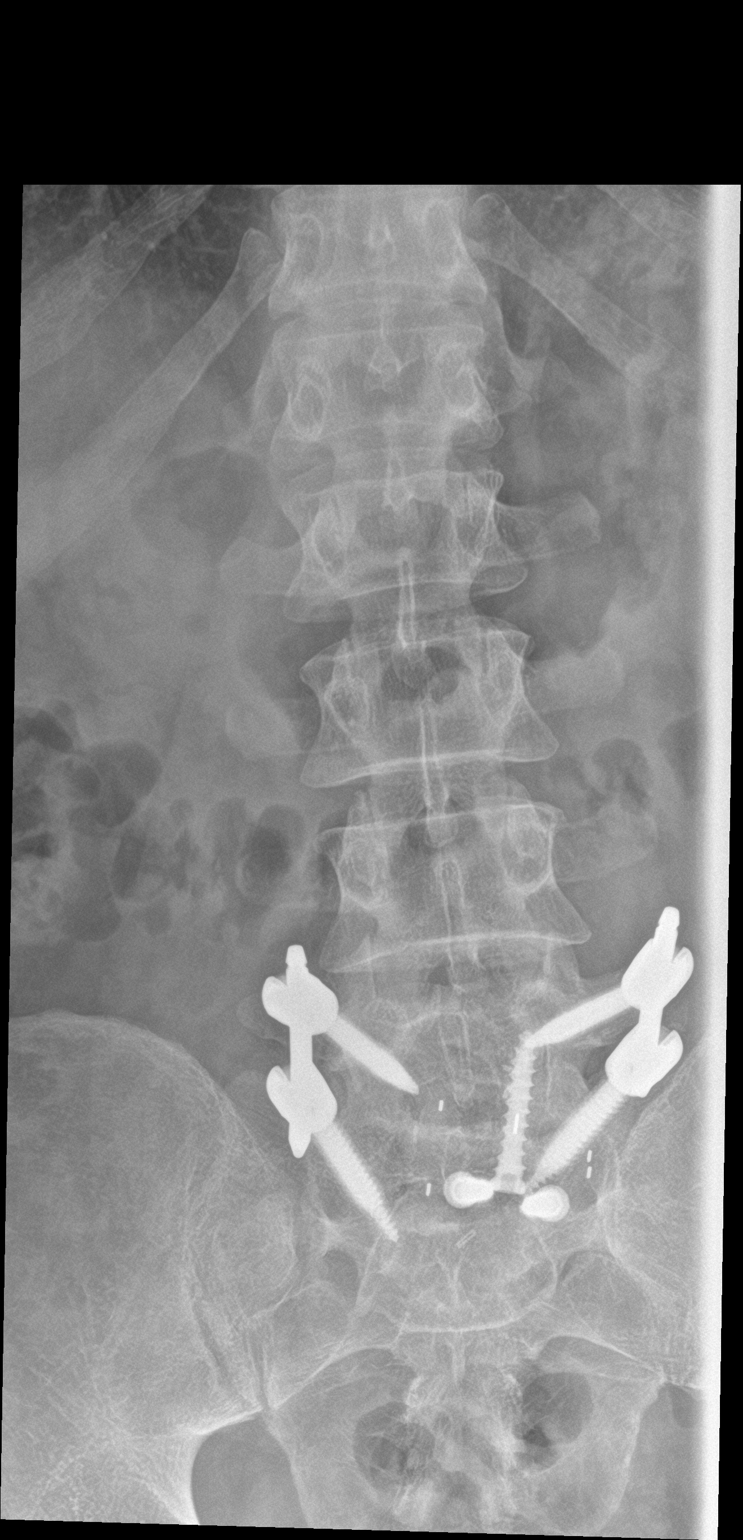

[l-spine lat]
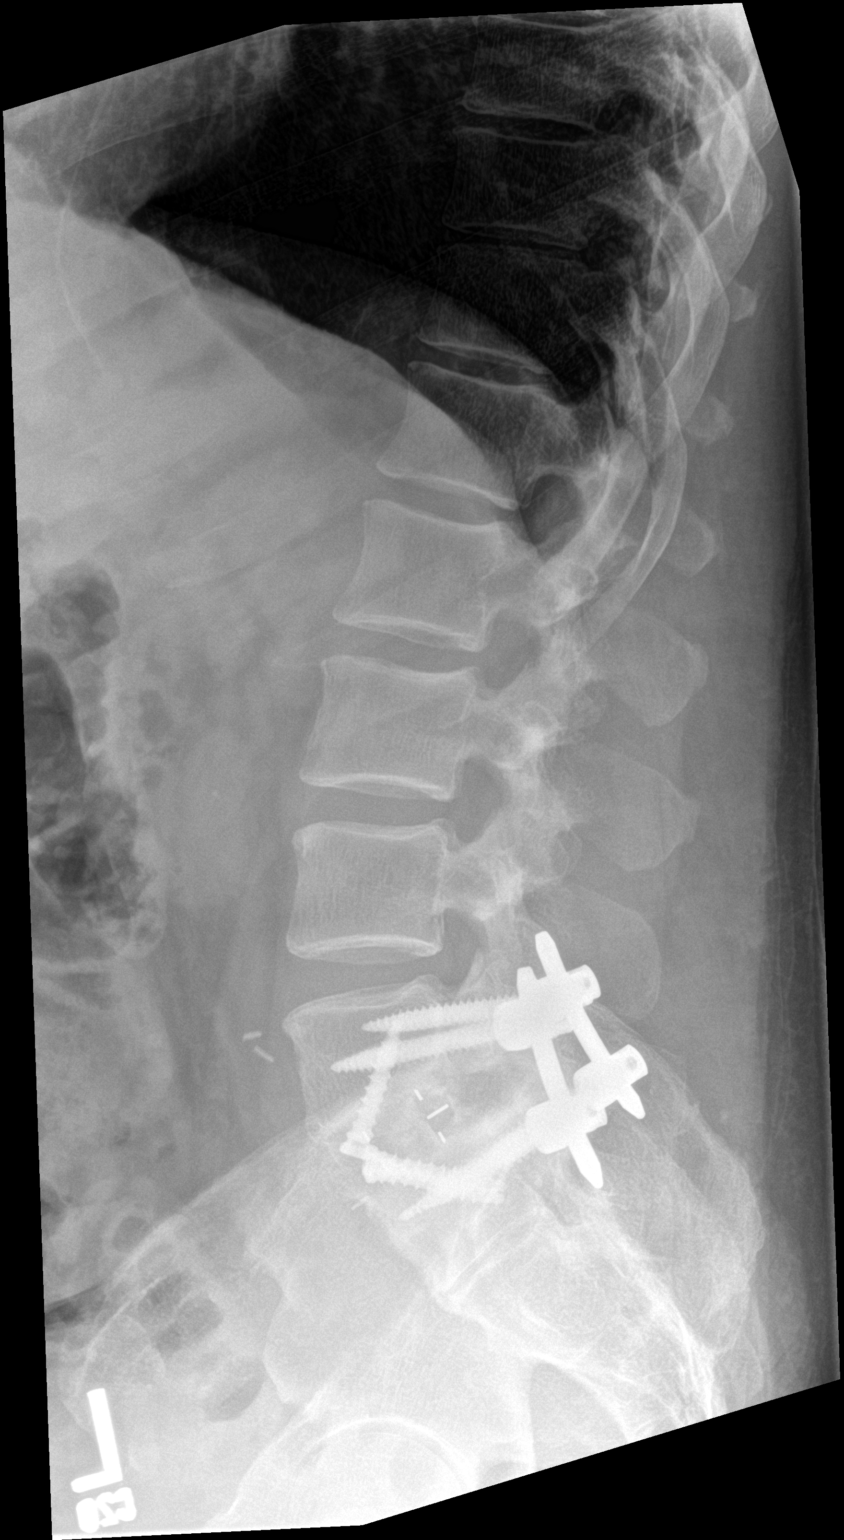

[l-spine spot]
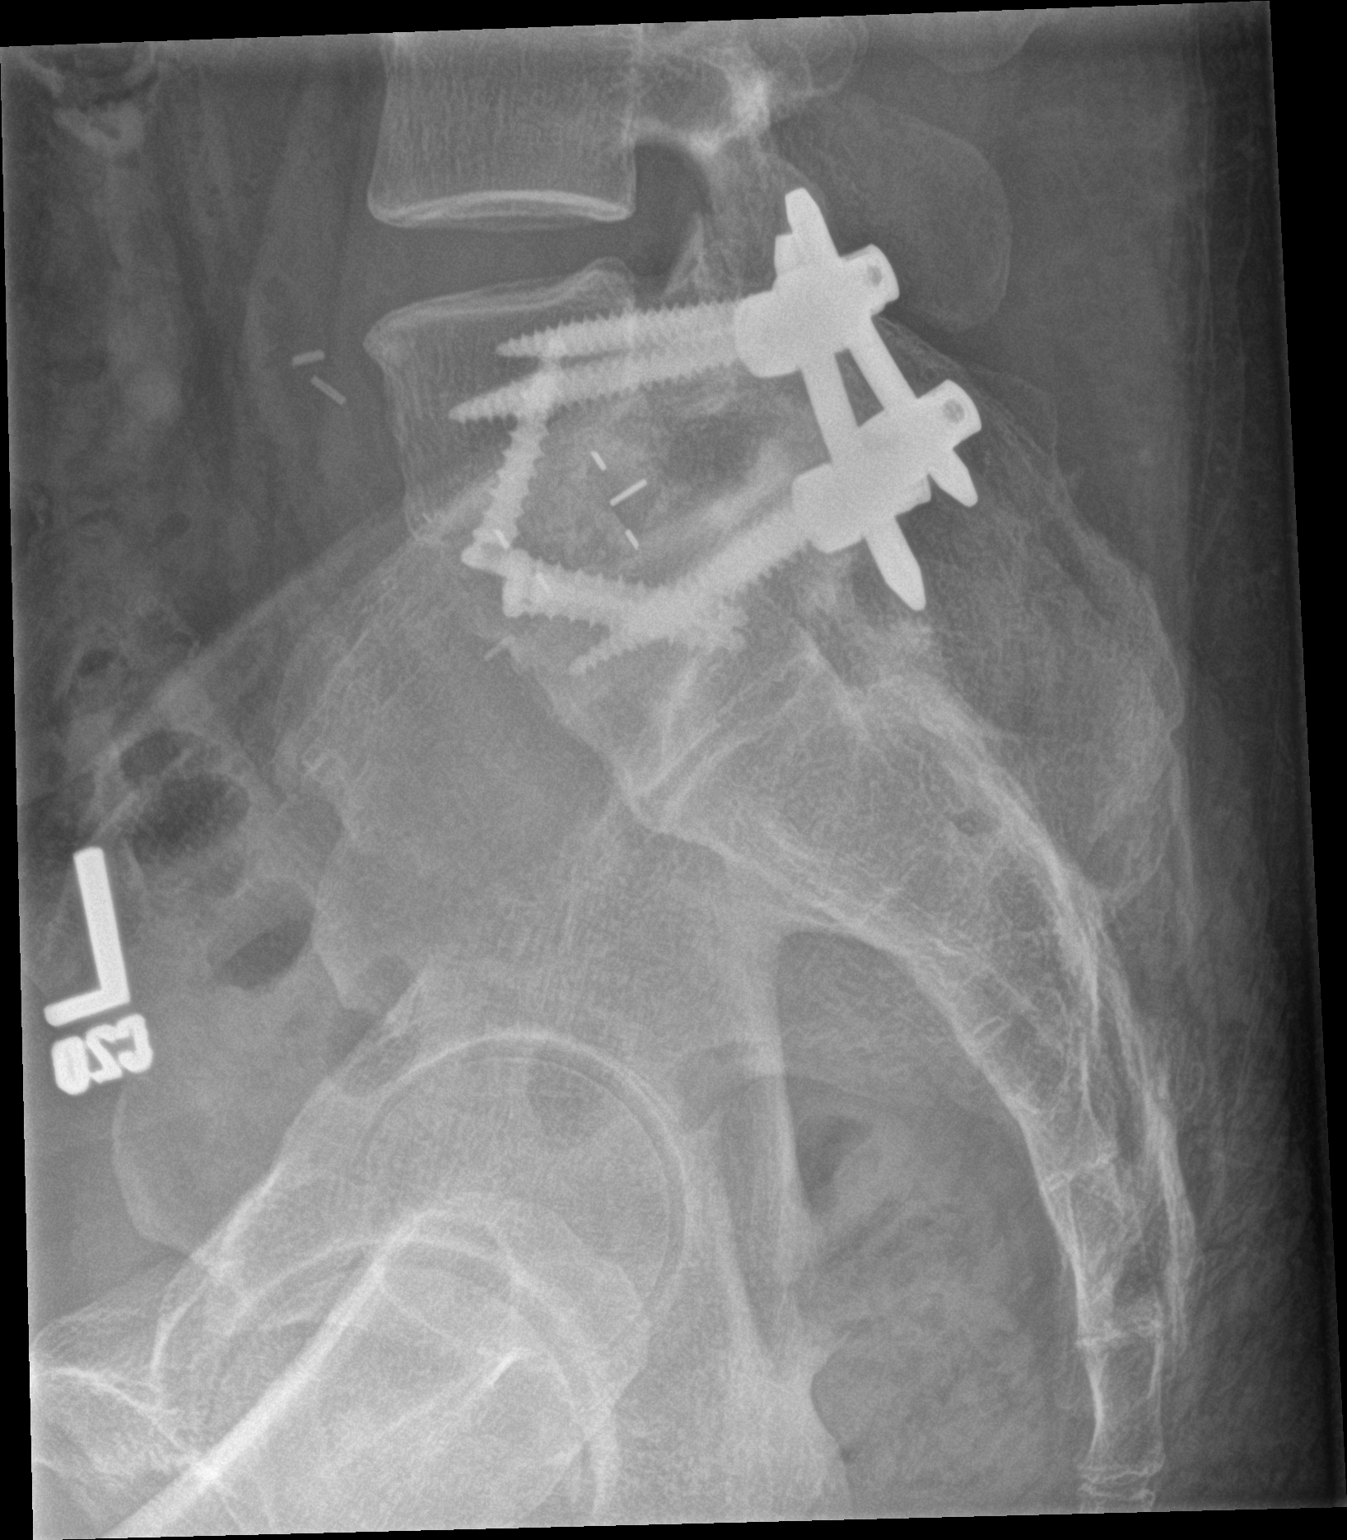

[3 of 3 positions shown; findings below may reference images not displayed]

FINDINGS: Distant discectomy and fusion at the L5-S1 level. No sign of
nonunion or hardware complication. Other lumbar region disc heights
are normal. No evidence of regional fracture.
IMPRESSION: No traumatic finding. Distant discectomy and fusion at L5-S1 has a
good appearance by radiography.

## 2021-08-27 DEATH — deceased
